# Patient Record
Sex: Female | Born: 1970 | Race: White | Hispanic: No | Marital: Single | Smoking: Never smoker
Health system: Southern US, Community
[De-identification: ages and names within clinical notes are randomized; demographics above are authoritative.]

## PROBLEM LIST (undated history)

## (undated) DIAGNOSIS — E669 Obesity, unspecified: Secondary | ICD-10-CM

## (undated) DIAGNOSIS — E559 Vitamin D deficiency, unspecified: Secondary | ICD-10-CM

## (undated) DIAGNOSIS — M1990 Unspecified osteoarthritis, unspecified site: Secondary | ICD-10-CM

## (undated) DIAGNOSIS — E079 Disorder of thyroid, unspecified: Secondary | ICD-10-CM

## (undated) DIAGNOSIS — F419 Anxiety disorder, unspecified: Secondary | ICD-10-CM

## (undated) DIAGNOSIS — E039 Hypothyroidism, unspecified: Secondary | ICD-10-CM

## (undated) DIAGNOSIS — F32A Depression, unspecified: Secondary | ICD-10-CM

## (undated) DIAGNOSIS — E785 Hyperlipidemia, unspecified: Secondary | ICD-10-CM

## (undated) DIAGNOSIS — F329 Major depressive disorder, single episode, unspecified: Secondary | ICD-10-CM

## (undated) HISTORY — PX: NASAL SINUS SURGERY: SHX719

## (undated) HISTORY — DX: Anxiety disorder, unspecified: F41.9

## (undated) HISTORY — PX: TUBAL LIGATION: SHX77

## (undated) HISTORY — DX: Unspecified osteoarthritis, unspecified site: M19.90

## (undated) HISTORY — PX: WISDOM TOOTH EXTRACTION: SHX21

## (undated) HISTORY — DX: Obesity, unspecified: E66.9

## (undated) HISTORY — DX: Disorder of thyroid, unspecified: E07.9

## (undated) HISTORY — DX: Major depressive disorder, single episode, unspecified: F32.9

## (undated) HISTORY — DX: Vitamin D deficiency, unspecified: E55.9

## (undated) HISTORY — DX: Depression, unspecified: F32.A

## (undated) HISTORY — DX: Hyperlipidemia, unspecified: E78.5

## (undated) HISTORY — DX: Hypothyroidism, unspecified: E03.9

---

## 1998-09-03 ENCOUNTER — Other Ambulatory Visit: Admission: RE | Admit: 1998-09-03 | Discharge: 1998-09-03 | Payer: Self-pay | Admitting: Obstetrics and Gynecology

## 1999-06-07 ENCOUNTER — Other Ambulatory Visit: Admission: RE | Admit: 1999-06-07 | Discharge: 1999-06-07 | Payer: Self-pay | Admitting: Obstetrics and Gynecology

## 2000-01-12 ENCOUNTER — Encounter (INDEPENDENT_AMBULATORY_CARE_PROVIDER_SITE_OTHER): Payer: Self-pay

## 2000-01-12 ENCOUNTER — Inpatient Hospital Stay (HOSPITAL_COMMUNITY): Admission: AD | Admit: 2000-01-12 | Discharge: 2000-01-16 | Payer: Self-pay | Admitting: Obstetrics and Gynecology

## 2000-01-17 ENCOUNTER — Encounter: Admission: RE | Admit: 2000-01-17 | Discharge: 2000-03-01 | Payer: Self-pay | Admitting: Obstetrics and Gynecology

## 2000-01-17 ENCOUNTER — Inpatient Hospital Stay (HOSPITAL_COMMUNITY): Admission: AD | Admit: 2000-01-17 | Discharge: 2000-01-17 | Payer: Self-pay | Admitting: *Deleted

## 2000-08-22 ENCOUNTER — Other Ambulatory Visit: Admission: RE | Admit: 2000-08-22 | Discharge: 2000-08-22 | Payer: Self-pay | Admitting: Obstetrics and Gynecology

## 2001-11-18 ENCOUNTER — Other Ambulatory Visit: Admission: RE | Admit: 2001-11-18 | Discharge: 2001-11-18 | Payer: Self-pay | Admitting: Obstetrics and Gynecology

## 2002-12-24 ENCOUNTER — Other Ambulatory Visit: Admission: RE | Admit: 2002-12-24 | Discharge: 2002-12-24 | Payer: Self-pay | Admitting: Obstetrics and Gynecology

## 2003-08-14 ENCOUNTER — Ambulatory Visit (HOSPITAL_COMMUNITY): Admission: RE | Admit: 2003-08-14 | Discharge: 2003-08-14 | Payer: Self-pay | Admitting: Obstetrics and Gynecology

## 2003-12-28 ENCOUNTER — Encounter (INDEPENDENT_AMBULATORY_CARE_PROVIDER_SITE_OTHER): Payer: Self-pay | Admitting: *Deleted

## 2003-12-28 ENCOUNTER — Inpatient Hospital Stay (HOSPITAL_COMMUNITY): Admission: RE | Admit: 2003-12-28 | Discharge: 2003-12-31 | Payer: Self-pay | Admitting: Obstetrics and Gynecology

## 2004-02-08 ENCOUNTER — Other Ambulatory Visit: Admission: RE | Admit: 2004-02-08 | Discharge: 2004-02-08 | Payer: Self-pay | Admitting: Obstetrics and Gynecology

## 2004-11-07 ENCOUNTER — Emergency Department (HOSPITAL_COMMUNITY): Admission: EM | Admit: 2004-11-07 | Discharge: 2004-11-08 | Payer: Self-pay | Admitting: Emergency Medicine

## 2005-05-02 ENCOUNTER — Other Ambulatory Visit: Admission: RE | Admit: 2005-05-02 | Discharge: 2005-05-02 | Payer: Self-pay | Admitting: Obstetrics and Gynecology

## 2006-01-04 ENCOUNTER — Ambulatory Visit (HOSPITAL_COMMUNITY): Admission: RE | Admit: 2006-01-04 | Discharge: 2006-01-04 | Payer: Self-pay | Admitting: Internal Medicine

## 2008-05-01 ENCOUNTER — Ambulatory Visit: Payer: Self-pay | Admitting: Internal Medicine

## 2008-06-26 ENCOUNTER — Ambulatory Visit: Payer: Self-pay | Admitting: Internal Medicine

## 2009-01-22 ENCOUNTER — Ambulatory Visit: Payer: Self-pay | Admitting: Internal Medicine

## 2009-08-17 ENCOUNTER — Other Ambulatory Visit: Admission: RE | Admit: 2009-08-17 | Discharge: 2009-08-17 | Payer: Self-pay | Admitting: Internal Medicine

## 2009-08-17 ENCOUNTER — Ambulatory Visit: Payer: Self-pay | Admitting: Internal Medicine

## 2010-02-17 ENCOUNTER — Ambulatory Visit: Payer: Self-pay | Admitting: Internal Medicine

## 2010-03-09 ENCOUNTER — Ambulatory Visit (HOSPITAL_COMMUNITY)
Admission: RE | Admit: 2010-03-09 | Discharge: 2010-03-09 | Payer: Self-pay | Source: Home / Self Care | Attending: Internal Medicine | Admitting: Internal Medicine

## 2010-08-19 NOTE — Discharge Summary (Signed)
Brandy Price, Brandy Price         ACCOUNT NO.:  [id number]   MEDICAL RECORD NO.:  [id number]          PATIENT TYPE:  INP   LOCATION:  9138                          FACILITY:  [facility]   PHYSICIAN:  Janine Limbo, M.D.DATE OF BIRTH:  January 11, 1971   DATE OF ADMISSION:  12/28/2003  DATE OF DISCHARGE:  12/31/2003                                 DISCHARGE SUMMARY   ADMISSION DIAGNOSES:  1.  Term pregnancy.  2.  Prior cesarean section.  3.  Desires elective sterilization.   DISCHARGE DIAGNOSES:  1.  Term pregnancy.  2.  Prior cesarean section.  3.  Desires elective sterilization.  4.  Status post repeat low transverse cesarean section of a viable female      infant named Jonnie Finner weighing 8 pounds 11 ounces, Apgars 8 and      9.  5.  Status post bilateral tubal ligation.  6.  Difficult spinal anesthesia with wet tap.   HOSPITAL PROCEDURES:  1.  Spinal anesthesia.  2.  Repeat low transverse cesarean section.  3.  Elective bilateral tubal ligation.   HOSPITAL COURSE:  The patient was admitted for an elective repeat low  transverse cesarean section with bilateral tubal ligation.  Multiple  attempts at spinal anesthesia were required to achieve spinal anesthesia.  The surgery progressed without incident for an 8-pound 11-ounce female  infant, Apgars 8 and 9, with EBL of 700 mL.  The patient was taken to the  mother-baby unit after recovery where she did well.  Vital signs were  stable.  Hemoglobin was 11.3.  She was breast feeding and continued her care  on postoperative day #2 and #3.  On December 31, 2003 she was ready to go  home.  She did have some soreness at her spinal site.  Vital signs are  stable.  There was a small abrasion noted at her spinal site with a small  amount of erythema but no induration.  Abdomen was soft and appropriately  tender.  Incision was clean, dry, and intact.  The lochia was small,  extremities within normal limits.  She was seen by anesthesia  for the  abrasion and was discharged home after she was deemed stable.   DISCHARGE MEDICATIONS:  1.  Motrin 600 mg p.o. q.6h. p.r.n.  2.  Tylox one to two p.o. q.4h. p.r.n.   DISCHARGE LABORATORY DATA:  White blood cell count 11.2, hemoglobin 11.3,  platelets 249.   DISCHARGE INSTRUCTIONS:  Per [facility] handout.   DISCHARGE FOLLOW-UP:  In 6 weeks or p.r.n.      MLW/MEDQ  D:  12/31/2003  T:  12/31/2003  Job:  [id number]

## 2010-08-19 NOTE — Op Note (Signed)
[facility]  Patient:    BANEZA, BARTOSZEK                  MRN: [id number] Proc. Date: 01/12/00 Adm. Date:  [id number] Attending:  Cleatrice Burke                           Operative Report  PREOPERATIVE DIAGNOSES:       1. Term pregnancy.                               2. Meconium-stained fluid.                               3. Nonreassuring fetal heart rate tracing.                               4. Maternal fever.  POSTOPERATIVE DIAGNOSES:      1. Term pregnancy.                               2. Meconium-stained fluid.                               3. Nonreassuring fetal heart rate tracing.                               4. Maternal fever.                               5. Occipitoposterior.                               6. Cord entanglement.  OPERATION PERFORMED:          Low cervical transverse cesarean section.  SURGEON:                      Georgina Peer, M.D.  ASSISTANT:                    Miguel Dibble, C.N.M.  ANESTHESIA:                   Epidural by Gretta Cool., M.D.  ESTIMATED BLOOD LOSS:         400 cc.  COMPLICATIONS:                None.  FINDINGS:                     A viable female infant delivered at 2147 hours, occipitoposterior with cord around the shoulder and leg.  Thin meconium-stained fluid.  Apgars 8 and 9.  INDICATIONS:                  This is a 40 year old female who is a gravida 1, para 0, with an EDC of January 07, 2000, at 40-5/7 weeks.  She presented with ruptured membranes and meconium-stained fluid in the early morning of January 12, 2000.  She presented in labor.  She  had episodes of variable decelerations lasting anywhere from 2-12 minutes.  Amnioinfusion to thin the meconium-stained fluid and to try an resolve the variables was attempted.  The patient developed a low-grade fever and was started on Unasyn.  She had been group B streptococcus and had initially been on penicillin.  The  patient was given Tylenol for fever and the fever defervesced, but the fetus remained tachycardic with heart rates between 160-180, which did not respond to repositioning, oxygen, and hydration.  The cervix became completely dilated. However, it was the opinion of the surgeon that the fetus would not tolerate labor with this persistent tachycardia, the meconium-stained fluid, and the fever.  Therefore, cesarean section was planned and consented.  DESCRIPTION OF PROCEDURE:     The patient was taken to the operating room.  An epidural had been placed during labor.  A Foley catheter had been placed during labor.  She was prepped and draped in a normal sterile fashion after adequate anesthesia was established.  A transverse skin incision was made and taken into the peritoneal cavity in the normal Pfannenstiel manner.  A transverse bladder flap was created along with a transverse uterine incision. The amnioinfusion had thinned the meconium significantly.  The infant was in the occipitoposterior presentation, rotated to anterior, and delivered with the aid of fundal pressure.  The mouth and nose were suctioned with DeLee bulb.  The infant had its cord doubly clamped and cut and was handed to the pediatric team in attendance.  Cord blood and cord pH were obtained from the artery.  Apgars were assigned at 8 and 9.  The placenta was removed and sent to pathology.  The incision was without extension and closed in one layer with a running locked Vicryl with interrupted sutures of Vicryl to control any bleeding.  The tubes and ovaries were inspected and found to be normal.  The bladder flap, muscle, fascia, and peritoneum were hemostatic.  The fascia was closed with Vicryl suture.  The subcutaneous tissue was cauterized for any bleeding.  The skin was closed skin staples.  The sponge, needle, and instrument counts were correct.  The patient tolerated the procedure well and was sent to the recovery  area in stable condition. DD:  01/12/00 TD:  01/14/00 Job: [id number]

## 2010-08-19 NOTE — Op Note (Signed)
NAMECHANTILLE, Brandy Price         ACCOUNT NO.:  [id number]   MEDICAL RECORD NO.:  [id number]          PATIENT TYPE:  INP   LOCATION:  9138                          FACILITY:  [facility]   PHYSICIAN:  Janine Limbo, M.D.DATE OF BIRTH:  09-02-1970   DATE OF PROCEDURE:  12/28/2003  DATE OF DISCHARGE:                                 OPERATIVE REPORT   PREOPERATIVE DIAGNOSES:  1.  Term intrauterine pregnancy.  2.  Prior cesarean section.  3.  Desires repeat cesarean section.  4.  Desires sterilization.   POSTOPERATIVE DIAGNOSES:  1.  Term intrauterine pregnancy.  2.  Prior cesarean section.  3.  Desires repeat cesarean section.  4.  Desires sterilization.   PROCEDURE:  Repeat low transverse cesarean section and bilateral tubal  ligation.   SURGEON:  Janine Limbo, M.D.   FIRST ASSISTANT:  Marie L. Williams, C.N.M.   ANESTHESIA:  Spinal.   DISPOSITION:  Ms. Sculley is a 40 year old female, gravida 2, para 1-0-0-  1, who presents at term for repeat cesarean section and tubal ligation. She  understands the indications for her procedure and she accepts the associated  risks.   FINDINGS:  An 8 pound 11 ounce female infant Brandy Price) was delivered from a  cephalic presentation. The Apgar's were 8 at 1 minute and 9 at 5 minutes.  The uterus, fallopian tubes and the ovaries were normal for the gravid  state.   DESCRIPTION OF PROCEDURE:  The patient was taken to the operating room where  a spinal anesthetic was given.  The anesthesia department had a great deal  of difficulty putting the spinal in. The patient's abdomen, perineum and  outer vagina were prepped with multiple layers of Betadine. A Foley catheter  was placed in the bladder. The patient was then sterilely draped. The lower  abdomen was injected with 10 mL of 0.5% Marcaine with epinephrine.  A low  transverse incision was made and carried sharply through the subcutaneous  tissue, the fascia and the anterior  peritoneum.  The bladder flap was  developed. An incision was made in the lower uterine segment and extended  transversely. The fetal head was delivered. The mouth and nose were  suctioned. The remainder of the infant was delivered. A Nuchal cord was  present. The cord was clamped and cut and the infant was handed to the  awaiting pediatric team. Routine cord blood studies were obtained. The  placenta was removed. The uterine cavity was cleaned of amniotic fluid,  clotted blood and membranes. The uterine incision was closed using a running  locking suture of 2-0 Vicryl.  Figure-of-eight sutures of 2-0 Vicryl were  used for hemostasis.  The pelvis was vigorously irrigated. The left  fallopian tube was identified and followed to its fimbriated end. A knuckle  of tube was made on the left using a pre-tie and then a ligature of #0 plain  catgut. The knuckle of tube _________ was excised.  Hemostasis was adequate.  An identical procedure was carried out on the opposite side. Again  hemostasis was adequate. The anterior peritoneum and the abdominal  musculature were reapproximated in the  midline. The fascia was irrigated.  The fascia was closed using a running suture of #0 Vicryl followed by three  interrupted sutures of #0 Vicryl. The subcutaneous tissue was irrigated. The  subcutaneous layer was closed using a running suture of #0 Vicryl. The skin  was reapproximated using a subcuticular suture of 4-0 Vicryl.  Sponge,  needle, and instrument counts were correct on two occasions. The estimated  blood loss was 700 mL.  The patient tolerated the procedure well.  The  patient was taken to the recovery room in stable condition. The infant was  taken to the fullterm nursery in stable condition.      AVS/MEDQ  D:  12/28/2003  T:  12/29/2003  Job:  [id number]

## 2010-08-19 NOTE — H&P (Signed)
Brandy Price, Brandy Price         ACCOUNT NO.:  [id number]   MEDICAL RECORD NO.:  [id number]          PATIENT TYPE:  INP   LOCATION:  NA                            [facility]  [facility]   PHYSICIAN:  Janine Limbo, M.D.DATE OF BIRTH:  1970-12-10   DATE OF ADMISSION:  12/28/2003  DATE OF DISCHARGE:                                HISTORY & PHYSICAL   HISTORY OF PRESENT ILLNESS:  Ms. Brandy Price is a 40 year old female, gravida  2 para 1-0-0-1, who presents at [redacted] weeks gestation ([facility] January 09, 2004).  She has been followed at the [facility].  This pregnancy has been  complicated by the fact that her age is greater than 35.  She declined  amniocentesis.  The patient has had a prior cesarean section and she plans a  repeat cesarean section.  The patient had a positive beta strep culture with  her prior pregnancy but a negative beta strep culture in the third trimester  during this pregnancy.   OBSTETRICAL HISTORY:  In 2001 the patient had a 9-pound female infant at [redacted]  weeks gestation.   DRUG ALLERGIES:  No known drug allergies.   PAST MEDICAL HISTORY:  The patient denies hypertension and diabetes.  She  had her wisdom teeth removed at age 23.  She had a cesarean section in 2001.   REVIEW OF SYSTEMS:  Normal pregnancy complaints.   SOCIAL HISTORY:  The patient is a married homemaker.  She denies cigarette  use, alcohol use, and recreational drug use.   FAMILY HISTORY:  Noncontributory.   PHYSICAL EXAMINATION:  VITALS:  Weight is 219 pounds.  HEENT:  Within normal limits.  CHEST:  Clear.  HEART:  Regular rate and rhythm.  BREASTS:  Without masses.  ABDOMEN:  Gravid with a fundal height of 37 cm.  EXTREMITIES:  Grossly normal.  NEUROLOGIC:  Grossly normal.  PELVIC:  Cervix was closed and long when last checked.   LABORATORY VALUES:  Blood type is AB positive, antibody screen negative,  VDRL nonreactive,  rubella positive, HBsAg negative, GC negative, Chlamydia  negative, Pap smear within normal limits, third trimester beta strep is  negative, glucola screen was 68.   ASSESSMENT:  1.  Thirty weight weeks gestation.  2.  Prior cesarean section.  3.  Desires sterilization.   PLAN:  The patient will undergo a repeat low transverse cesarean section and  bilateral tubal ligation.  She understands the indications for her procedure  and she accepts the risk of, but not limited to, anesthetic complications,  bleeding, infections, and possible damage to the surrounding organs.  She  understands that there is a small but real failure rate associated with  tubal ligation (17 per 1000).      AVS/MEDQ  D:  12/27/2003  T:  12/27/2003  Job:  [id number]

## 2010-08-19 NOTE — H&P (Signed)
[facility]  Patient:    Brandy Price, Brandy Price                  MRN: [id number] Adm. Date:  [id number] Attending:  Cleatrice Burke Dictator:   Miguel Dibble, C.N.M.                         History and Physical  DATE OF BIRTH:                1970-09-03.  HISTORY OF PRESENT ILLNESS:   This is a 40 year old gravida 1, para 0 at 40-6/[redacted] weeks pregnant, who reported spontaneous rupture of membranes, initially clear fluid at 4:15 a.m..  She presented to Maternity Admissions Unit two hours later with gross rupture of membranes, meconium-stained fluid. Cervix was 3 cm, 85% effaced, vertex at -1, by R.N. exam.  She was contracting at every three and a half to four and a half minutes, with a negative CST. She was admitted for labor management by the physician service.  PRENATAL LABORATORY DATA:     At entry into the practice:  Hemoglobin 12.1, hematocrit 37.1, platelets 296,000.  Blood type and Rh:  AB-positive, Rh-antibodies negative.  Toxoplasmosis negative.  VDRL nonreactive.  Rubella titer immune.  CMV titers negative.  Hepatitis B surface antigen negative. HIV nonreactive.  Pap smear within normal limits.  Gonorrhea and Chlamydia culture negative.  PPD test in December of 2000 negative.  Glucola challenge test at 28 weeks is 82.  Patient transferred from care from another practice with Dr. Lafonda Mosses B. Collins at approximately 26-2/7 weeks with records.  She had had no problems at that point during her pregnancy.  At 36 weeks, group beta strep positive.  She had had an ultrasound that confirmed EDC of October 6th.  ALLERGIES:                    No known drug allergies.  MEDICAL HISTORY:              UTIs in the past.  Fractured arm at age 45. Wisdom tooth extraction in 1990.  Cyst removed from her nose.  FAMILY HISTORY:               Family history is significant for maternal grandmother with Alzheimers disease; otherwise, negative.  GENETIC  HISTORY:              Genetic history is significant for paternal first cousin with heart defect and died at age 38 and paternal first cousin with mental retardation.  SOCIAL HISTORY:               Caucasian.  Christian religion.  Married to [employer].  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  Father of the baby is a Engineer, maintenance (IT) and works for [employer].  Stable monogamous relationship.  Denies smoking, alcohol or drug abuse.  PHYSICAL EXAMINATION  HEENT:                        Within normal limits.  LUNGS:                        Bilaterally clear.  HEART:                        Regular rate and rhythm.  ABDOMEN:  Soft, nontender.  Contractions every three and a half to four and a half minutes.  Fetal heart rate:  Mild variable deceleration during vaginal exam; otherwise, reassuring with negative CST.  EXTREMITIES:                  Trace edema.  DTRs +1.  PELVIC:                       Cervix 3 cm, 85% effaced, vertex -1.  ASSESSMENT:                   Primigravida at term, positive group beta streptococcus, ruptured membranes with meconium-stained fluid, in early labor.  PLAN:                         Admit to labor and delivery.  Plan for M.D. management per patient request.  Notify _____________ of admission and status.  Anticipate routine [facility] orders including group beta strep prophylaxis with penicillin.  Anticipate normal spontaneous vaginal delivery. DD:  01/12/00 TD:  01/12/00 Job: [id number]

## 2010-08-19 NOTE — Discharge Summary (Signed)
[facility]  Patient:    Brandy Price, Brandy Price                  MRN: [id number] Adm. Date:  [id number] Disc. Date: 01/16/00 Attending:  Cleatrice Burke Dictator:   Miguel Dibble, C.N.M.                           Discharge Summary  DATE OF BIRTH:                03/20/1971  ADMITTING DIAGNOSES:          1. Intrauterine pregnancy at term with                                  spontaneous rupture of membranes five hours                                  prior to admission with clear fluid.                               2. Positive group B strep.                               3. Early labor.                               4. Primigravida.  DISCHARGE DIAGNOSES:          1. Intrauterine pregnancy at term with                                  spontaneous rupture of membranes five hours                                  prior to admission with clear fluid.                               2. Positive group B strep.                               3. Early labor.                               4. Primigravida.                               5. Nonreassuring fetal heart tones.                               6. Maternal fever.                               7. Meconium-stained fluid.  8. Delivered by primary lower segment transverse                                  cesarean section, viable baby boy, Apgars 8                                  and 9, weight 8 pounds 15.9 ounces, cord                                  around the shoulder and leg.  PROCEDURES:                   1. Primary lower segment transverse cesarean                                  section.                               2. Epidural anesthesia.                               3. Internal fetal monitoring.                               4. Amnioinfusion.  HOSPITAL COURSE:              On January 12, 2000, at approximately 10 a.m., the patient, Brandy Price, was  admitted with a history of ruptured membranes approximately 5-1/2 hours earlier with clear fluid and mild contractions.  Her cervix was 3, 85%, vertex, -1, with gross rupture of membranes, clear fluid at the time.  By 9:40 a.m., she was having contractions every 3-5 minutes with accelerations, occasional variable decelerations.  By 11 a.m., the patient was experiencing a deceleration to the 80s with very slow recovery after 8 minutes with position changes and O2.  Recovery to 110-120s. Amnioinfusion was resumed.  Pitocin had been discontinued at the time of the decelerations.  By 1430, baseline heart rate was 150-160s, cervix was 5 cm completely effaced, vertex at 0 station.  An epidural was given.  By 2000 hours, heart rate was 180s-190s, temperature 100.9, short-term variability was satisfactory.  Contractions had spread to every 5-7 minutes.  There was no improvement with amnioinfusion, and it was discontinued.  The cervix was an interior lip vertex at -1 with molding.  Prominent ischial spines were present.  There was a prolonged variable deceleration with slow recovery.  By 2115, heart rate persisted in the 165-180s.  Temperature was within normal limits.  Cervix was complete vertex at -120; however, there was decreased short-term variability.  After discussions with the patient and the family, the decision was made to proceed with primary lower segment transverse cesarean section.  She delivered a viable baby boy with the cord wrapped around the body, shoulder, and legs.  Thin, meconium-stained fluid, Apgars 8 and 9.  She recovered satisfactorily from her cesarean section.  On postoperative day one on January 13, 2000, she was afebrile.  Hemoglobin had dropped from 11.3 to 10.9; lochia was small, incision  clean, dry and intact. By postoperative day two, she continued to be afebrile.  Her baby was in the NICU with possible meconium aspiration versus pneumonia versus tachypnea, and she  was spending a considerable amount of time with the baby in the nursery. By postoperative day three, she continued to recover satisfactorily with pumping her breasts for eventual breast feeding.  Incision was clean, dry, and intact.  Vital signs stable.  She decided to stay an extra day which was covered by her insurance.  By postoperative day four, on January 16, 2000, vital signs were stable, fundus firm one below the umbilicus, lochia was scant, abdomen soft and nontender.  She was passing flatus, had good bowel sounds.  Incision was clean, dry, and intact.  She was discharged home after having staples removed and Steri-Strips applied and deeming to have received the full benefit of her hospitalization.  She would come frequently to visit the baby after discharge.  DISCHARGE INSTRUCTIONS:       Per [facility] booklet.  DISCHARGE MEDICATIONS:        Motrin, Tylox, prenatal vitamins, over-the-counter iron.  She would decide on contraception at her six week visit.  DISCHARGE FOLLOW-UP:          In six weeks at [facility]. DD:  01/16/00 TD:  01/15/00 Job: [id number]

## 2010-09-16 ENCOUNTER — Other Ambulatory Visit: Payer: 59 | Admitting: Internal Medicine

## 2010-09-16 DIAGNOSIS — E039 Hypothyroidism, unspecified: Secondary | ICD-10-CM

## 2010-09-16 LAB — TSH: TSH: 2.156 u[IU]/mL (ref 0.350–4.500)

## 2010-09-19 ENCOUNTER — Encounter: Payer: Self-pay | Admitting: Internal Medicine

## 2010-10-10 ENCOUNTER — Other Ambulatory Visit: Payer: Self-pay | Admitting: *Deleted

## 2010-10-10 MED ORDER — LEVOTHYROXINE SODIUM 100 MCG PO TABS: 100.0000 ug | ORAL_TABLET | Freq: Every day | ORAL | Status: DC

## 2010-10-31 ENCOUNTER — Other Ambulatory Visit: Payer: 59 | Admitting: Internal Medicine

## 2010-10-31 DIAGNOSIS — Z0000 Encounter for general adult medical examination without abnormal findings: Secondary | ICD-10-CM

## 2010-10-31 LAB — CBC WITH DIFFERENTIAL/PLATELET
Basophils Absolute: 0.1 10*3/uL (ref 0.0–0.1)
Lymphocytes Relative: 30 % (ref 12–46)
Neutro Abs: 3.8 10*3/uL (ref 1.7–7.7)
Neutrophils Relative %: 58 % (ref 43–77)
Platelets: 373 10*3/uL (ref 150–400)
RDW: 12.7 % (ref 11.5–15.5)
WBC: 6.5 10*3/uL (ref 4.0–10.5)

## 2010-10-31 LAB — COMPREHENSIVE METABOLIC PANEL
ALT: 44 U/L — ABNORMAL HIGH (ref 0–35)
AST: 30 U/L (ref 0–37)
Albumin: 4.1 g/dL (ref 3.5–5.2)
Calcium: 9.8 mg/dL (ref 8.4–10.5)
Chloride: 103 mEq/L (ref 96–112)
Creat: 0.66 mg/dL (ref 0.50–1.10)
Potassium: 4.7 mEq/L (ref 3.5–5.3)

## 2010-10-31 LAB — LIPID PANEL
LDL Cholesterol: 162 mg/dL — ABNORMAL HIGH (ref 0–99)
Triglycerides: 115 mg/dL (ref <150)
VLDL: 23 mg/dL (ref 0–40)

## 2010-11-01 ENCOUNTER — Encounter: Payer: Self-pay | Admitting: Internal Medicine

## 2010-11-01 ENCOUNTER — Ambulatory Visit (INDEPENDENT_AMBULATORY_CARE_PROVIDER_SITE_OTHER): Payer: 59 | Admitting: Internal Medicine

## 2010-11-01 ENCOUNTER — Other Ambulatory Visit (HOSPITAL_COMMUNITY)
Admission: RE | Admit: 2010-11-01 | Discharge: 2010-11-01 | Disposition: A | Discharge status: Home / Self Care | Payer: 59 | Source: Ambulatory Visit | Attending: Internal Medicine | Admitting: Internal Medicine

## 2010-11-01 VITALS — BP 112/66 | HR 78 | Temp 99.0°F | Ht 68.0 in | Wt 230.0 lb

## 2010-11-01 DIAGNOSIS — E785 Hyperlipidemia, unspecified: Secondary | ICD-10-CM

## 2010-11-01 DIAGNOSIS — F419 Anxiety disorder, unspecified: Secondary | ICD-10-CM

## 2010-11-01 DIAGNOSIS — E039 Hypothyroidism, unspecified: Secondary | ICD-10-CM

## 2010-11-01 DIAGNOSIS — Z0000 Encounter for general adult medical examination without abnormal findings: Secondary | ICD-10-CM

## 2010-11-01 DIAGNOSIS — F341 Dysthymic disorder: Secondary | ICD-10-CM

## 2010-11-01 DIAGNOSIS — E669 Obesity, unspecified: Secondary | ICD-10-CM

## 2010-11-01 DIAGNOSIS — Z01419 Encounter for gynecological examination (general) (routine) without abnormal findings: Secondary | ICD-10-CM | POA: Insufficient documentation

## 2010-11-01 DIAGNOSIS — F32A Depression, unspecified: Secondary | ICD-10-CM

## 2010-11-01 DIAGNOSIS — Z124 Encounter for screening for malignant neoplasm of cervix: Secondary | ICD-10-CM

## 2010-11-01 LAB — POCT URINALYSIS DIPSTICK
Blood, UA: NEGATIVE
Glucose, UA: NEGATIVE
Nitrite, UA: NEGATIVE
Protein, UA: NEGATIVE
Urobilinogen, UA: NEGATIVE
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
## 2011-02-15 ENCOUNTER — Other Ambulatory Visit: Payer: Self-pay | Admitting: Internal Medicine

## 2011-02-15 DIAGNOSIS — Z1231 Encounter for screening mammogram for malignant neoplasm of breast: Secondary | ICD-10-CM

## 2011-03-30 ENCOUNTER — Ambulatory Visit (HOSPITAL_COMMUNITY)
Admission: RE | Admit: 2011-03-30 | Discharge: 2011-03-30 | Disposition: A | Discharge status: Home / Self Care | Payer: 59 | Source: Ambulatory Visit | Attending: Internal Medicine | Admitting: Internal Medicine

## 2011-03-30 DIAGNOSIS — Z1231 Encounter for screening mammogram for malignant neoplasm of breast: Secondary | ICD-10-CM | POA: Insufficient documentation

## 2011-03-31 NOTE — Progress Notes (Signed)
Patient has appt 04/20/11 to see Dr. Lenord Fellers.  Dr Lenord Fellers is aware.  LMS

## 2011-04-18 ENCOUNTER — Other Ambulatory Visit: Payer: 59 | Admitting: Internal Medicine

## 2011-04-20 ENCOUNTER — Ambulatory Visit: Payer: 59 | Admitting: Internal Medicine

## 2011-05-19 ENCOUNTER — Other Ambulatory Visit: Payer: Self-pay

## 2011-06-19 ENCOUNTER — Other Ambulatory Visit: Payer: Self-pay

## 2011-06-19 MED ORDER — LEVOTHYROXINE SODIUM 100 MCG PO TABS: 100.0000 ug | ORAL_TABLET | Freq: Every day | ORAL | Status: DC

## 2011-06-26 ENCOUNTER — Other Ambulatory Visit: Payer: Self-pay

## 2011-06-26 MED ORDER — SERTRALINE HCL 100 MG PO TABS: 100.0000 mg | ORAL_TABLET | Freq: Every day | ORAL | Status: DC

## 2011-07-14 ENCOUNTER — Other Ambulatory Visit: Payer: 59 | Admitting: Internal Medicine

## 2011-07-14 ENCOUNTER — Other Ambulatory Visit: Payer: Self-pay | Admitting: Internal Medicine

## 2011-07-14 DIAGNOSIS — E785 Hyperlipidemia, unspecified: Secondary | ICD-10-CM

## 2011-07-15 LAB — LIPID PANEL
LDL Cholesterol: 148 mg/dL — ABNORMAL HIGH (ref 0–99)
Triglycerides: 74 mg/dL (ref <150)

## 2011-07-18 ENCOUNTER — Ambulatory Visit (INDEPENDENT_AMBULATORY_CARE_PROVIDER_SITE_OTHER): Payer: 59 | Admitting: Internal Medicine

## 2011-07-18 ENCOUNTER — Encounter: Payer: Self-pay | Admitting: Internal Medicine

## 2011-07-18 VITALS — BP 120/80 | HR 80 | Resp 12 | Wt 235.0 lb

## 2011-07-18 DIAGNOSIS — F329 Major depressive disorder, single episode, unspecified: Secondary | ICD-10-CM

## 2011-07-18 DIAGNOSIS — F32A Depression, unspecified: Secondary | ICD-10-CM

## 2011-07-18 DIAGNOSIS — E785 Hyperlipidemia, unspecified: Secondary | ICD-10-CM

## 2011-07-18 DIAGNOSIS — E039 Hypothyroidism, unspecified: Secondary | ICD-10-CM

## 2011-07-18 LAB — HEPATIC FUNCTION PANEL
Alkaline Phosphatase: 69 U/L (ref 39–117)
Bilirubin, Direct: 0.1 mg/dL (ref 0.0–0.3)
Indirect Bilirubin: 0.6 mg/dL (ref 0.0–0.9)
Total Bilirubin: 0.7 mg/dL (ref 0.3–1.2)
Total Protein: 6.7 g/dL (ref 6.0–8.3)

## 2011-07-18 NOTE — Patient Instructions (Addendum)
Increase Zocor to 20 mg daily. Return in 6 months for fasting lab work and physical examination. Continue same dose of Synthroid.

## 2011-07-23 NOTE — Progress Notes (Signed)
  Subjective:    Patient ID: Brandy Price, female    DOB: Aug 27, 1970, 41 y.o.   MRN: [id number]  HPI  41 year old white female with history of hypothyroidism, hyperlipidemia, and depression in today for six-month followup. No complaints or problems. Also has history of hyperlipidemia. She is overweight. Needs to diet and exercise. She is a single parent, divorced, raising 2 small children. They're in elementary school. Is on Zoloft for depression. Patient is on Zocor 10 mg daily for hyperlipidemia. Recent lipid panel shows total cholesterol and LDL still not to be at goal.    Review of Systems     Objective:   Physical Exam neck no thyromegaly or adenopathy; chest clear to auscultation; cardiac exam regular rate and rhythm, extremities without edema        Assessment & Plan:  Hypothyroidism  Depression  Hyperlipidemia  Plan: TSH reviewed. Continue same dose of Synthroid to return in 6 months for physical examination. Increase Zocor generic to 20 mg daily. Will obtain fasting lab work in 6 months at time of physical exam. Continue Zoloft for depression.

## 2011-07-26 ENCOUNTER — Other Ambulatory Visit: Payer: Self-pay

## 2011-07-26 MED ORDER — LEVOTHYROXINE SODIUM 100 MCG PO TABS: 100.0000 ug | ORAL_TABLET | Freq: Every day | ORAL | Status: DC

## 2011-07-31 ENCOUNTER — Encounter: Payer: Self-pay | Admitting: Internal Medicine

## 2011-12-12 DIAGNOSIS — Z0000 Encounter for general adult medical examination without abnormal findings: Secondary | ICD-10-CM

## 2012-01-09 ENCOUNTER — Other Ambulatory Visit: Payer: Self-pay | Admitting: Internal Medicine

## 2012-01-18 ENCOUNTER — Other Ambulatory Visit: Payer: 59 | Admitting: Internal Medicine

## 2012-01-19 ENCOUNTER — Encounter: Payer: 59 | Admitting: Internal Medicine

## 2012-02-07 ENCOUNTER — Other Ambulatory Visit: Payer: Self-pay | Admitting: Internal Medicine

## 2012-02-08 ENCOUNTER — Other Ambulatory Visit: Payer: Self-pay

## 2012-02-08 MED ORDER — LEVOTHYROXINE SODIUM 100 MCG PO TABS: 100.0000 ug | ORAL_TABLET | Freq: Every day | ORAL | Status: DC

## 2012-02-08 NOTE — Telephone Encounter (Signed)
Patient has CPE scheduled for January 7,2014

## 2012-02-15 ENCOUNTER — Other Ambulatory Visit: Payer: Self-pay | Admitting: Internal Medicine

## 2012-04-19 ENCOUNTER — Other Ambulatory Visit: Payer: Self-pay | Admitting: Internal Medicine

## 2012-04-19 ENCOUNTER — Other Ambulatory Visit: Payer: 59 | Admitting: Internal Medicine

## 2012-04-19 DIAGNOSIS — Z0000 Encounter for general adult medical examination without abnormal findings: Secondary | ICD-10-CM

## 2012-04-19 DIAGNOSIS — E785 Hyperlipidemia, unspecified: Secondary | ICD-10-CM

## 2012-04-19 DIAGNOSIS — E039 Hypothyroidism, unspecified: Secondary | ICD-10-CM

## 2012-04-19 LAB — CBC WITH DIFFERENTIAL/PLATELET
Eosinophils Absolute: 0.1 10*3/uL (ref 0.0–0.7)
Eosinophils Relative: 2 % (ref 0–5)
HCT: 39.2 % (ref 36.0–46.0)
Hemoglobin: 13.3 g/dL (ref 12.0–15.0)
Lymphocytes Relative: 24 % (ref 12–46)
Lymphs Abs: 2 10*3/uL (ref 0.7–4.0)
MCH: 30.3 pg (ref 26.0–34.0)
MCV: 89.3 fL (ref 78.0–100.0)
Monocytes Relative: 8 % (ref 3–12)
RBC: 4.39 MIL/uL (ref 3.87–5.11)

## 2012-04-19 LAB — COMPREHENSIVE METABOLIC PANEL
CO2: 26 mEq/L (ref 19–32)
Calcium: 9.3 mg/dL (ref 8.4–10.5)
Chloride: 106 mEq/L (ref 96–112)
Creat: 0.64 mg/dL (ref 0.50–1.10)
Glucose, Bld: 77 mg/dL (ref 70–99)
Total Bilirubin: 0.7 mg/dL (ref 0.3–1.2)
Total Protein: 6.9 g/dL (ref 6.0–8.3)

## 2012-04-19 LAB — LIPID PANEL
Cholesterol: 181 mg/dL (ref 0–200)
Total CHOL/HDL Ratio: 3.4 Ratio
Triglycerides: 84 mg/dL (ref <150)
VLDL: 17 mg/dL (ref 0–40)

## 2012-04-22 ENCOUNTER — Encounter: Payer: Self-pay | Admitting: Internal Medicine

## 2012-04-22 ENCOUNTER — Ambulatory Visit (INDEPENDENT_AMBULATORY_CARE_PROVIDER_SITE_OTHER): Payer: 59 | Admitting: Internal Medicine

## 2012-04-22 VITALS — BP 128/82 | HR 76 | Temp 99.6°F | Ht 67.5 in | Wt 241.0 lb

## 2012-04-22 DIAGNOSIS — F329 Major depressive disorder, single episode, unspecified: Secondary | ICD-10-CM

## 2012-04-22 DIAGNOSIS — F32A Depression, unspecified: Secondary | ICD-10-CM

## 2012-04-22 DIAGNOSIS — E785 Hyperlipidemia, unspecified: Secondary | ICD-10-CM

## 2012-04-22 DIAGNOSIS — E669 Obesity, unspecified: Secondary | ICD-10-CM

## 2012-04-22 DIAGNOSIS — E039 Hypothyroidism, unspecified: Secondary | ICD-10-CM

## 2012-04-22 DIAGNOSIS — Z0000 Encounter for general adult medical examination without abnormal findings: Secondary | ICD-10-CM

## 2012-04-22 LAB — POCT URINALYSIS DIPSTICK
Bilirubin, UA: NEGATIVE
Glucose, UA: NEGATIVE
Ketones, UA: NEGATIVE
Leukocytes, UA: NEGATIVE
Nitrite, UA: NEGATIVE
pH, UA: 5.5

## 2012-04-22 LAB — HEMOGLOBIN A1C: Mean Plasma Glucose: 111 mg/dL (ref <117)

## 2012-04-24 ENCOUNTER — Other Ambulatory Visit: Payer: Self-pay | Admitting: Internal Medicine

## 2012-04-24 DIAGNOSIS — Z1231 Encounter for screening mammogram for malignant neoplasm of breast: Secondary | ICD-10-CM

## 2012-05-09 ENCOUNTER — Ambulatory Visit (HOSPITAL_COMMUNITY)
Admission: RE | Admit: 2012-05-09 | Discharge: 2012-05-09 | Disposition: A | Discharge status: Home / Self Care | Payer: 59 | Source: Ambulatory Visit | Attending: Internal Medicine | Admitting: Internal Medicine

## 2012-05-09 DIAGNOSIS — Z1231 Encounter for screening mammogram for malignant neoplasm of breast: Secondary | ICD-10-CM | POA: Insufficient documentation
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
## 2012-06-09 NOTE — Patient Instructions (Addendum)
Continue same medications and return in one year. Mammogram annually. Diet exercise and lose weight.

## 2012-06-17 ENCOUNTER — Other Ambulatory Visit: Payer: Self-pay | Admitting: Internal Medicine

## 2012-06-24 ENCOUNTER — Other Ambulatory Visit: Payer: Self-pay | Admitting: Internal Medicine

## 2012-06-26 ENCOUNTER — Other Ambulatory Visit: Payer: Self-pay | Admitting: Internal Medicine

## 2012-06-28 ENCOUNTER — Other Ambulatory Visit: Payer: 59 | Admitting: Internal Medicine

## 2012-07-01 ENCOUNTER — Other Ambulatory Visit: Payer: Self-pay | Admitting: Internal Medicine

## 2012-07-04 ENCOUNTER — Other Ambulatory Visit: Payer: 59 | Admitting: Internal Medicine

## 2012-07-04 DIAGNOSIS — E039 Hypothyroidism, unspecified: Secondary | ICD-10-CM

## 2012-07-05 NOTE — Progress Notes (Signed)
Patient informed. 

## 2012-07-14 ENCOUNTER — Other Ambulatory Visit: Payer: Self-pay | Admitting: Internal Medicine

## 2012-07-17 ENCOUNTER — Other Ambulatory Visit: Payer: Self-pay | Admitting: Internal Medicine

## 2012-07-17 ENCOUNTER — Other Ambulatory Visit: Payer: Self-pay

## 2012-07-17 MED ORDER — SIMVASTATIN 20 MG PO TABS: 20.0000 mg | ORAL_TABLET | Freq: Every day | ORAL | Status: DC

## 2012-08-27 ENCOUNTER — Other Ambulatory Visit: Payer: Self-pay | Admitting: Internal Medicine

## 2012-10-28 ENCOUNTER — Other Ambulatory Visit: Payer: 59 | Admitting: Internal Medicine

## 2012-10-28 DIAGNOSIS — R7301 Impaired fasting glucose: Secondary | ICD-10-CM

## 2012-10-28 DIAGNOSIS — Z79899 Other long term (current) drug therapy: Secondary | ICD-10-CM

## 2012-10-28 DIAGNOSIS — E785 Hyperlipidemia, unspecified: Secondary | ICD-10-CM

## 2012-10-28 DIAGNOSIS — E039 Hypothyroidism, unspecified: Secondary | ICD-10-CM

## 2012-10-28 LAB — HEPATIC FUNCTION PANEL
ALT: 21 U/L (ref 0–35)
AST: 17 U/L (ref 0–37)
Alkaline Phosphatase: 69 U/L (ref 39–117)
Indirect Bilirubin: 0.5 mg/dL (ref 0.0–0.9)

## 2012-10-28 LAB — LIPID PANEL
Cholesterol: 191 mg/dL (ref 0–200)
HDL: 50 mg/dL (ref >39)
LDL Cholesterol: 122 mg/dL — ABNORMAL HIGH (ref 0–99)
Triglycerides: 94 mg/dL (ref <150)

## 2012-10-29 ENCOUNTER — Other Ambulatory Visit: Payer: 59 | Admitting: Internal Medicine

## 2012-10-29 LAB — TSH: TSH: 2.88 u[IU]/mL (ref 0.350–4.500)

## 2012-10-29 LAB — HEMOGLOBIN A1C
Hgb A1c MFr Bld: 5.3 % (ref <5.7)
Mean Plasma Glucose: 105 mg/dL (ref <117)

## 2012-10-31 ENCOUNTER — Ambulatory Visit (INDEPENDENT_AMBULATORY_CARE_PROVIDER_SITE_OTHER): Payer: 59 | Admitting: Internal Medicine

## 2012-10-31 ENCOUNTER — Encounter: Payer: Self-pay | Admitting: Internal Medicine

## 2012-10-31 VITALS — BP 124/84 | HR 68 | Temp 99.0°F | Wt 248.0 lb

## 2012-10-31 DIAGNOSIS — E7800 Pure hypercholesterolemia, unspecified: Secondary | ICD-10-CM

## 2012-10-31 DIAGNOSIS — E039 Hypothyroidism, unspecified: Secondary | ICD-10-CM

## 2012-10-31 DIAGNOSIS — F32A Depression, unspecified: Secondary | ICD-10-CM

## 2012-10-31 DIAGNOSIS — F419 Anxiety disorder, unspecified: Secondary | ICD-10-CM

## 2012-10-31 DIAGNOSIS — F341 Dysthymic disorder: Secondary | ICD-10-CM

## 2012-10-31 DIAGNOSIS — E669 Obesity, unspecified: Secondary | ICD-10-CM

## 2012-10-31 NOTE — Progress Notes (Signed)
  Subjective:    Patient ID: Brandy Price, female    DOB: 01/04/1971, 42 y.o.   MRN: [id number]  HPI  42 year old white female with history of obesity, hypothyroidism, hyperlipidemia, anxiety, and depression. She is on Zocor 20 mg daily, Zoloft 100 mg daily and thyroid replacement therapy. She was started on Zocor and 2012. History of postpartum depression 2005 that persisted with a divorce and raising 2 children. Not motivated to diet and exercise. Talked with her about this today. Really needs to get serious about losing some weight. LDL checked on July 28 shows a value of 122 and previously was 111 6 months ago. TSH is within normal limits.      Review of Systems     Objective:   Physical Exam Affect is normal. Seems cheerful. No thyromegaly       Assessment & Plan:  Obesity-needs to get serious about diet exercise  Hyperlipidemia-slight increase in LDL from 6 months ago from 111-122  Hypothyroidism treated with thyroid replacement therapy. TSH within normal limits  History of anxiety and depression-treated with Zoloft  Plan: Continue same medications and return in 6 months. Try to get patient to walk 30 minutes several times weekly.  Spent 25 minutes speaking with patient about obesity diet and exercise, anxiety depression hyperlipidemia

## 2012-12-18 ENCOUNTER — Other Ambulatory Visit: Payer: Self-pay

## 2012-12-18 MED ORDER — LEVOTHYROXINE SODIUM 100 MCG PO TABS: 100.0000 ug | ORAL_TABLET | Freq: Every day | ORAL | Status: DC

## 2013-03-08 ENCOUNTER — Other Ambulatory Visit: Payer: Self-pay | Admitting: Internal Medicine

## 2013-04-03 ENCOUNTER — Other Ambulatory Visit: Payer: Self-pay | Admitting: Internal Medicine

## 2013-04-21 ENCOUNTER — Other Ambulatory Visit: Payer: Self-pay

## 2013-04-21 MED ORDER — SERTRALINE HCL 100 MG PO TABS: 100.0000 mg | ORAL_TABLET | Freq: Every day | ORAL | Status: DC

## 2013-04-21 MED ORDER — SIMVASTATIN 20 MG PO TABS: 20.0000 mg | ORAL_TABLET | Freq: Every day | ORAL | Status: DC

## 2013-04-28 ENCOUNTER — Other Ambulatory Visit: Payer: 59 | Admitting: Internal Medicine

## 2013-05-02 ENCOUNTER — Encounter: Payer: 59 | Admitting: Internal Medicine

## 2013-05-15 ENCOUNTER — Other Ambulatory Visit: Payer: 59 | Admitting: Internal Medicine

## 2013-05-16 ENCOUNTER — Encounter: Payer: 59 | Admitting: Internal Medicine

## 2013-08-13 ENCOUNTER — Other Ambulatory Visit: Payer: Self-pay | Admitting: Internal Medicine

## 2013-08-13 DIAGNOSIS — Z1231 Encounter for screening mammogram for malignant neoplasm of breast: Secondary | ICD-10-CM

## 2013-08-19 ENCOUNTER — Ambulatory Visit (HOSPITAL_COMMUNITY): Payer: 59

## 2013-08-26 ENCOUNTER — Ambulatory Visit (HOSPITAL_COMMUNITY)
Admission: RE | Admit: 2013-08-26 | Discharge: 2013-08-26 | Disposition: A | Discharge status: Home / Self Care | Payer: Commercial Managed Care - PPO | Source: Ambulatory Visit | Attending: Internal Medicine | Admitting: Internal Medicine

## 2013-08-26 DIAGNOSIS — Z1231 Encounter for screening mammogram for malignant neoplasm of breast: Secondary | ICD-10-CM | POA: Insufficient documentation

## 2013-08-28 ENCOUNTER — Other Ambulatory Visit: Payer: Self-pay

## 2013-08-28 MED ORDER — LEVOTHYROXINE SODIUM 100 MCG PO TABS: 100.0000 ug | ORAL_TABLET | Freq: Every day | ORAL | Status: DC

## 2013-11-02 ENCOUNTER — Other Ambulatory Visit: Payer: Self-pay | Admitting: Internal Medicine

## 2013-11-02 NOTE — Telephone Encounter (Signed)
Not seen since July 2014. Needs CPE. Please book before refilling.

## 2013-11-03 ENCOUNTER — Other Ambulatory Visit: Payer: Self-pay

## 2013-12-11 ENCOUNTER — Other Ambulatory Visit: Payer: Commercial Managed Care - PPO | Admitting: Internal Medicine

## 2013-12-11 DIAGNOSIS — Z130 Encounter for screening for diseases of the blood and blood-forming organs and certain disorders involving the immune mechanism: Secondary | ICD-10-CM

## 2013-12-11 DIAGNOSIS — E669 Obesity, unspecified: Secondary | ICD-10-CM

## 2013-12-11 DIAGNOSIS — Z13228 Encounter for screening for other metabolic disorders: Secondary | ICD-10-CM

## 2013-12-11 DIAGNOSIS — Z0000 Encounter for general adult medical examination without abnormal findings: Secondary | ICD-10-CM

## 2013-12-11 DIAGNOSIS — Z1329 Encounter for screening for other suspected endocrine disorder: Secondary | ICD-10-CM

## 2013-12-11 DIAGNOSIS — E785 Hyperlipidemia, unspecified: Secondary | ICD-10-CM

## 2013-12-11 DIAGNOSIS — E039 Hypothyroidism, unspecified: Secondary | ICD-10-CM

## 2013-12-12 ENCOUNTER — Ambulatory Visit (INDEPENDENT_AMBULATORY_CARE_PROVIDER_SITE_OTHER): Payer: Commercial Managed Care - PPO | Admitting: Internal Medicine

## 2013-12-12 ENCOUNTER — Encounter: Payer: Self-pay | Admitting: Internal Medicine

## 2013-12-12 ENCOUNTER — Other Ambulatory Visit (HOSPITAL_COMMUNITY)
Admission: RE | Admit: 2013-12-12 | Discharge: 2013-12-12 | Disposition: A | Discharge status: Home / Self Care | Payer: Commercial Managed Care - PPO | Source: Ambulatory Visit | Attending: Internal Medicine | Admitting: Internal Medicine

## 2013-12-12 VITALS — BP 122/80 | HR 100 | Ht 67.0 in | Wt 250.0 lb

## 2013-12-12 DIAGNOSIS — E785 Hyperlipidemia, unspecified: Secondary | ICD-10-CM

## 2013-12-12 DIAGNOSIS — F3289 Other specified depressive episodes: Secondary | ICD-10-CM

## 2013-12-12 DIAGNOSIS — E039 Hypothyroidism, unspecified: Secondary | ICD-10-CM

## 2013-12-12 DIAGNOSIS — F329 Major depressive disorder, single episode, unspecified: Secondary | ICD-10-CM

## 2013-12-12 DIAGNOSIS — Z01419 Encounter for gynecological examination (general) (routine) without abnormal findings: Secondary | ICD-10-CM | POA: Diagnosis present

## 2013-12-12 DIAGNOSIS — F32A Depression, unspecified: Secondary | ICD-10-CM

## 2013-12-12 DIAGNOSIS — E669 Obesity, unspecified: Secondary | ICD-10-CM

## 2013-12-12 DIAGNOSIS — Z0000 Encounter for general adult medical examination without abnormal findings: Secondary | ICD-10-CM

## 2013-12-12 LAB — CBC WITH DIFFERENTIAL/PLATELET
BASOS PCT: 1 % (ref 0–1)
Basophils Absolute: 0.1 10*3/uL (ref 0.0–0.1)
EOS ABS: 0.1 10*3/uL (ref 0.0–0.7)
Eosinophils Relative: 2 % (ref 0–5)
HEMATOCRIT: 40.9 % (ref 36.0–46.0)
HEMOGLOBIN: 13.8 g/dL (ref 12.0–15.0)
LYMPHS ABS: 2 10*3/uL (ref 0.7–4.0)
Lymphocytes Relative: 28 % (ref 12–46)
MCH: 29.9 pg (ref 26.0–34.0)
MCHC: 33.7 g/dL (ref 30.0–36.0)
MCV: 88.7 fL (ref 78.0–100.0)
Monocytes Absolute: 0.5 10*3/uL (ref 0.1–1.0)
Monocytes Relative: 7 % (ref 3–12)
NEUTROS ABS: 4.3 10*3/uL (ref 1.7–7.7)
NEUTROS PCT: 62 % (ref 43–77)
Platelets: 370 10*3/uL (ref 150–400)
RBC: 4.61 MIL/uL (ref 3.87–5.11)
RDW: 13.6 % (ref 11.5–15.5)
WBC: 7 10*3/uL (ref 4.0–10.5)

## 2013-12-12 LAB — COMPREHENSIVE METABOLIC PANEL
ALBUMIN: 4.1 g/dL (ref 3.5–5.2)
ALK PHOS: 80 U/L (ref 39–117)
ALT: 19 U/L (ref 0–35)
AST: 17 U/L (ref 0–37)
BUN: 11 mg/dL (ref 6–23)
CO2: 26 mEq/L (ref 19–32)
Calcium: 9.3 mg/dL (ref 8.4–10.5)
Chloride: 101 mEq/L (ref 96–112)
Creat: 0.69 mg/dL (ref 0.50–1.10)
Glucose, Bld: 87 mg/dL (ref 70–99)
POTASSIUM: 4 meq/L (ref 3.5–5.3)
SODIUM: 138 meq/L (ref 135–145)
TOTAL PROTEIN: 6.8 g/dL (ref 6.0–8.3)
Total Bilirubin: 0.7 mg/dL (ref 0.2–1.2)

## 2013-12-12 LAB — POCT URINALYSIS DIPSTICK
Bilirubin, UA: NEGATIVE
GLUCOSE UA: NEGATIVE
Ketones, UA: NEGATIVE
LEUKOCYTES UA: NEGATIVE
NITRITE UA: NEGATIVE
Protein, UA: NEGATIVE
RBC UA: NEGATIVE
Spec Grav, UA: 1.025
UROBILINOGEN UA: NEGATIVE
pH, UA: 5

## 2013-12-12 LAB — LIPID PANEL
Cholesterol: 175 mg/dL (ref 0–200)
HDL: 52 mg/dL (ref >39)
LDL CALC: 105 mg/dL — AB (ref 0–99)
Total CHOL/HDL Ratio: 3.4 Ratio
Triglycerides: 91 mg/dL (ref <150)
VLDL: 18 mg/dL (ref 0–40)

## 2013-12-12 LAB — VITAMIN D 25 HYDROXY (VIT D DEFICIENCY, FRACTURES): Vit D, 25-Hydroxy: 52 ng/mL (ref 30–89)

## 2013-12-12 LAB — TSH: TSH: 3.527 u[IU]/mL (ref 0.350–4.500)

## 2013-12-12 MED ORDER — SERTRALINE HCL 100 MG PO TABS: 100.0000 mg | ORAL_TABLET | Freq: Every day | ORAL | Status: DC

## 2013-12-12 MED ORDER — LEVOTHYROXINE SODIUM 112 MCG PO TABS: 112.0000 ug | ORAL_TABLET | Freq: Every day | ORAL | Status: DC

## 2013-12-12 MED ORDER — SIMVASTATIN 20 MG PO TABS: 20.0000 mg | ORAL_TABLET | Freq: Every day | ORAL | Status: DC

## 2013-12-12 MED ORDER — PROMETHAZINE HCL 25 MG PO TABS: 25.0000 mg | ORAL_TABLET | ORAL | Status: DC | PRN
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
## 2013-12-16 LAB — CYTOLOGY - PAP

## 2014-02-22 ENCOUNTER — Encounter: Payer: Self-pay | Admitting: Internal Medicine

## 2014-02-22 NOTE — Patient Instructions (Signed)
Continue same medications and return in one year. Encouraged diet exercise and weight loss.

## 2014-03-24 ENCOUNTER — Other Ambulatory Visit: Payer: Commercial Managed Care - PPO | Admitting: Internal Medicine

## 2014-03-24 DIAGNOSIS — E039 Hypothyroidism, unspecified: Secondary | ICD-10-CM

## 2014-03-24 LAB — TSH: TSH: 3.092 u[IU]/mL (ref 0.350–4.500)

## 2014-03-30 ENCOUNTER — Telehealth: Payer: Self-pay | Admitting: *Deleted

## 2014-03-31 NOTE — Telephone Encounter (Signed)
Not totally compliant with thyroid replacement therapy. Missed 3 doses before TSH Rechecked recently. MUST take daily and return mid January for TSH only. Appt made for Monday Jan 18th

## 2014-04-01 ENCOUNTER — Telehealth: Payer: Self-pay | Admitting: *Deleted

## 2014-04-02 NOTE — Telephone Encounter (Signed)
Spoke with patient she states she did miss several doses but she did speak with Dr Lenord FellersBaxley and she will be more compliant with her medications and we will recheck her levels at a later date

## 2014-04-17 ENCOUNTER — Other Ambulatory Visit: Payer: Commercial Managed Care - PPO | Admitting: Internal Medicine

## 2014-04-17 DIAGNOSIS — E039 Hypothyroidism, unspecified: Secondary | ICD-10-CM

## 2014-04-17 LAB — TSH: TSH: 2.82 u[IU]/mL (ref 0.350–4.500)

## 2014-04-20 ENCOUNTER — Telehealth: Payer: Self-pay | Admitting: *Deleted

## 2014-04-20 NOTE — Telephone Encounter (Signed)
Reviewed lab results with patient . Instructed her to continue same dose of levothyroxine as directed

## 2014-04-27 ENCOUNTER — Other Ambulatory Visit: Payer: Self-pay | Admitting: *Deleted

## 2014-04-27 MED ORDER — LEVOTHYROXINE SODIUM 112 MCG PO TABS: 112.0000 ug | ORAL_TABLET | Freq: Every day | ORAL | Status: DC

## 2014-04-27 NOTE — Telephone Encounter (Signed)
Refills on synthroid sent to pharmacy

## 2014-07-20 ENCOUNTER — Other Ambulatory Visit: Payer: Self-pay | Admitting: Internal Medicine

## 2014-10-20 ENCOUNTER — Other Ambulatory Visit: Payer: Self-pay | Admitting: Internal Medicine

## 2014-10-20 DIAGNOSIS — Z1231 Encounter for screening mammogram for malignant neoplasm of breast: Secondary | ICD-10-CM

## 2014-10-26 ENCOUNTER — Other Ambulatory Visit: Payer: Self-pay | Admitting: Internal Medicine

## 2014-11-03 ENCOUNTER — Ambulatory Visit (HOSPITAL_COMMUNITY)
Admission: RE | Admit: 2014-11-03 | Discharge: 2014-11-03 | Disposition: A | Discharge status: Home / Self Care | Payer: Commercial Managed Care - PPO | Source: Ambulatory Visit | Attending: Internal Medicine | Admitting: Internal Medicine

## 2014-11-03 DIAGNOSIS — Z1231 Encounter for screening mammogram for malignant neoplasm of breast: Secondary | ICD-10-CM | POA: Insufficient documentation

## 2014-12-22 ENCOUNTER — Other Ambulatory Visit: Payer: Self-pay | Admitting: Internal Medicine

## 2015-01-18 ENCOUNTER — Other Ambulatory Visit: Payer: Commercial Managed Care - PPO | Admitting: Internal Medicine

## 2015-01-18 DIAGNOSIS — E785 Hyperlipidemia, unspecified: Secondary | ICD-10-CM

## 2015-01-18 DIAGNOSIS — Z1321 Encounter for screening for nutritional disorder: Secondary | ICD-10-CM

## 2015-01-18 DIAGNOSIS — E039 Hypothyroidism, unspecified: Secondary | ICD-10-CM

## 2015-01-18 DIAGNOSIS — Z13220 Encounter for screening for lipoid disorders: Secondary | ICD-10-CM

## 2015-01-18 DIAGNOSIS — Z1329 Encounter for screening for other suspected endocrine disorder: Secondary | ICD-10-CM

## 2015-01-18 DIAGNOSIS — Z130 Encounter for screening for diseases of the blood and blood-forming organs and certain disorders involving the immune mechanism: Secondary | ICD-10-CM

## 2015-01-18 DIAGNOSIS — Z0000 Encounter for general adult medical examination without abnormal findings: Secondary | ICD-10-CM

## 2015-01-18 LAB — COMPLETE METABOLIC PANEL WITH GFR
ALT: 28 U/L (ref 6–29)
AST: 18 U/L (ref 10–35)
Albumin: 4.1 g/dL (ref 3.6–5.1)
Alkaline Phosphatase: 79 U/L (ref 33–115)
BUN: 14 mg/dL (ref 7–25)
CALCIUM: 9.5 mg/dL (ref 8.6–10.2)
CHLORIDE: 105 mmol/L (ref 98–110)
CO2: 25 mmol/L (ref 20–31)
CREATININE: 0.7 mg/dL (ref 0.50–1.10)
GFR, Est African American: >89 mL/min (ref >=60)
GFR, Est Non African American: >89 mL/min (ref >=60)
Glucose, Bld: 90 mg/dL (ref 65–99)
Potassium: 4.8 mmol/L (ref 3.5–5.3)
Sodium: 140 mmol/L (ref 135–146)
Total Bilirubin: 0.7 mg/dL (ref 0.2–1.2)
Total Protein: 7.4 g/dL (ref 6.1–8.1)

## 2015-01-18 LAB — LIPID PANEL
Cholesterol: 227 mg/dL — ABNORMAL HIGH (ref 125–200)
HDL: 52 mg/dL (ref >=46)
LDL Cholesterol: 158 mg/dL — ABNORMAL HIGH (ref <130)
Total CHOL/HDL Ratio: 4.4 Ratio (ref <=5.0)
Triglycerides: 86 mg/dL (ref <150)
VLDL: 17 mg/dL (ref <30)

## 2015-01-18 LAB — TSH: TSH: 3.477 u[IU]/mL (ref 0.350–4.500)

## 2015-01-19 ENCOUNTER — Other Ambulatory Visit: Payer: Commercial Managed Care - PPO | Admitting: Internal Medicine

## 2015-01-19 LAB — VITAMIN D 25 HYDROXY (VIT D DEFICIENCY, FRACTURES): Vit D, 25-Hydroxy: 34 ng/mL (ref 30–100)

## 2015-01-19 LAB — CBC WITH DIFFERENTIAL/PLATELET
BASOS ABS: 0.1 10*3/uL (ref 0.0–0.1)
Basophils Relative: 1 % (ref 0–1)
EOS PCT: 1 % (ref 0–5)
Eosinophils Absolute: 0.1 10*3/uL (ref 0.0–0.7)
HEMATOCRIT: 42.1 % (ref 36.0–46.0)
Hemoglobin: 14.4 g/dL (ref 12.0–15.0)
LYMPHS PCT: 28 % (ref 12–46)
Lymphs Abs: 2.3 10*3/uL (ref 0.7–4.0)
MCH: 30.5 pg (ref 26.0–34.0)
MCHC: 34.2 g/dL (ref 30.0–36.0)
MCV: 89.2 fL (ref 78.0–100.0)
MPV: 9.5 fL (ref 8.6–12.4)
Monocytes Absolute: 0.6 10*3/uL (ref 0.1–1.0)
Monocytes Relative: 8 % (ref 3–12)
NEUTROS PCT: 62 % (ref 43–77)
Neutro Abs: 5 10*3/uL (ref 1.7–7.7)
PLATELETS: 400 10*3/uL (ref 150–400)
RBC: 4.72 MIL/uL (ref 3.87–5.11)
RDW: 13.6 % (ref 11.5–15.5)
WBC: 8.1 10*3/uL (ref 4.0–10.5)

## 2015-01-21 ENCOUNTER — Ambulatory Visit (INDEPENDENT_AMBULATORY_CARE_PROVIDER_SITE_OTHER): Payer: Commercial Managed Care - PPO | Admitting: Internal Medicine

## 2015-01-21 ENCOUNTER — Encounter: Payer: Self-pay | Admitting: Internal Medicine

## 2015-01-21 VITALS — BP 134/72 | HR 74 | Temp 98.0°F | Resp 18 | Ht 67.0 in | Wt 249.0 lb

## 2015-01-21 DIAGNOSIS — E669 Obesity, unspecified: Secondary | ICD-10-CM | POA: Diagnosis not present

## 2015-01-21 DIAGNOSIS — Z0000 Encounter for general adult medical examination without abnormal findings: Secondary | ICD-10-CM | POA: Diagnosis not present

## 2015-01-21 DIAGNOSIS — E785 Hyperlipidemia, unspecified: Secondary | ICD-10-CM

## 2015-01-21 DIAGNOSIS — E039 Hypothyroidism, unspecified: Secondary | ICD-10-CM

## 2015-01-21 DIAGNOSIS — Z8659 Personal history of other mental and behavioral disorders: Secondary | ICD-10-CM | POA: Diagnosis not present

## 2015-01-21 LAB — POCT URINALYSIS DIPSTICK
Bilirubin, UA: NEGATIVE
GLUCOSE UA: NEGATIVE
Ketones, UA: NEGATIVE
LEUKOCYTES UA: NEGATIVE
NITRITE UA: NEGATIVE
Protein, UA: NEGATIVE
Spec Grav, UA: 1.025
UROBILINOGEN UA: 0.2
pH, UA: 6

## 2015-01-21 MED ORDER — PROMETHAZINE HCL 25 MG PO TABS: 25.0000 mg | ORAL_TABLET | Freq: Three times a day (TID) | ORAL | Status: DC | PRN

## 2015-01-21 MED ORDER — SERTRALINE HCL 100 MG PO TABS: 100.0000 mg | ORAL_TABLET | Freq: Every day | ORAL | Status: DC

## 2015-01-21 MED ORDER — LEVOTHYROXINE SODIUM 125 MCG PO CAPS
1.0000 | ORAL_CAPSULE | Freq: Every day | ORAL | Status: DC
Start: 2015-01-21 — End: 2015-04-19

## 2015-01-21 MED ORDER — SIMVASTATIN 20 MG PO TABS: 20.0000 mg | ORAL_TABLET | Freq: Every day | ORAL | Status: DC

## 2015-02-01 ENCOUNTER — Encounter: Payer: Self-pay | Admitting: Internal Medicine
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
## 2015-02-01 NOTE — Patient Instructions (Signed)
Increase Synthroid to 0.125 mg daily and return in December for TSH and fasting lipid panel only. Take lipid-lowering medicine daily. Please try to diet and exercise.

## 2015-03-05 ENCOUNTER — Other Ambulatory Visit: Payer: Commercial Managed Care - PPO | Admitting: Internal Medicine

## 2015-03-05 DIAGNOSIS — E039 Hypothyroidism, unspecified: Secondary | ICD-10-CM

## 2015-03-05 DIAGNOSIS — E785 Hyperlipidemia, unspecified: Secondary | ICD-10-CM

## 2015-03-05 LAB — LIPID PANEL
Cholesterol: 190 mg/dL (ref 125–200)
HDL: 53 mg/dL (ref >=46)
LDL CALC: 119 mg/dL (ref <130)
Total CHOL/HDL Ratio: 3.6 Ratio (ref <=5.0)
Triglycerides: 88 mg/dL (ref <150)
VLDL: 18 mg/dL (ref <30)

## 2015-03-05 LAB — TSH: TSH: 2.827 u[IU]/mL (ref 0.350–4.500)

## 2015-03-24 ENCOUNTER — Other Ambulatory Visit: Payer: Self-pay | Admitting: Internal Medicine

## 2015-04-14 ENCOUNTER — Other Ambulatory Visit: Payer: Self-pay | Admitting: Internal Medicine

## 2015-04-15 ENCOUNTER — Other Ambulatory Visit: Payer: Self-pay

## 2015-04-15 MED ORDER — LEVOTHYROXINE SODIUM 125 MCG PO CAPS: 1.0000 | ORAL_CAPSULE | Freq: Every day | ORAL | Status: DC

## 2015-04-15 NOTE — Telephone Encounter (Signed)
Fax request received 04/15/15

## 2015-04-19 ENCOUNTER — Other Ambulatory Visit: Payer: Self-pay | Admitting: Internal Medicine

## 2015-04-19 NOTE — Telephone Encounter (Signed)
Cannot close out

## 2015-06-03 ENCOUNTER — Other Ambulatory Visit: Payer: Self-pay | Admitting: Internal Medicine

## 2015-06-13 ENCOUNTER — Other Ambulatory Visit: Payer: Self-pay | Admitting: Internal Medicine

## 2015-09-11 ENCOUNTER — Other Ambulatory Visit: Payer: Self-pay | Admitting: Internal Medicine

## 2015-10-25 NOTE — Progress Notes (Signed)
Erroneous encounter

## 2015-10-26 ENCOUNTER — Encounter: Payer: Self-pay | Admitting: Internal Medicine

## 2015-10-26 ENCOUNTER — Ambulatory Visit (INDEPENDENT_AMBULATORY_CARE_PROVIDER_SITE_OTHER): Payer: Commercial Managed Care - PPO | Admitting: Internal Medicine

## 2015-10-26 VITALS — BP 132/62 | HR 72 | Temp 99.3°F | Ht 67.0 in | Wt 255.0 lb

## 2015-10-26 DIAGNOSIS — L237 Allergic contact dermatitis due to plants, except food: Secondary | ICD-10-CM | POA: Diagnosis not present

## 2015-10-26 DIAGNOSIS — E039 Hypothyroidism, unspecified: Secondary | ICD-10-CM

## 2015-10-26 DIAGNOSIS — F329 Major depressive disorder, single episode, unspecified: Secondary | ICD-10-CM | POA: Diagnosis not present

## 2015-10-26 DIAGNOSIS — F32A Depression, unspecified: Secondary | ICD-10-CM

## 2015-10-26 MED ORDER — METHYLPREDNISOLONE 4 MG PO TABS: ORAL_TABLET | ORAL | 0 refills | Status: DC

## 2015-10-26 MED ORDER — METHYLPREDNISOLONE ACETATE 80 MG/ML IJ SUSP
80.0000 mg | Freq: Once | INTRAMUSCULAR | Status: AC
  Administered 2015-10-26: 80 mg via INTRAMUSCULAR

## 2015-10-26 NOTE — Progress Notes (Signed)
   Subjective:    Patient ID: Brandy Price, female    DOB: 1970/07/29, 45 y.o.   MRN: [id number]  HPI 45 year old female with history of hypothyroidism and depression in today with contact dermatitis present for about a week. She had a telemedicine visit and was prescribed a six-day tapering course of prednisone around July 18. Symptoms have not improved. She has swelling and redness about her right eye. Multiple lesions on both arms.    Review of Systems as above     Objective:   Physical Exam Right periorbital area is red and swollen without drainage. Multiple linear vesicular lesions on both arms       Assessment & Plan:  Contact dermatitis-lesions are consistent with poison ivy  History of hypothyroidism  History of depression  Plan: She is a hardy had a short tapering course of prednisone and has not improved. Was given Depo-Medrol IM in the office and started on Medrol 4 mg tablets. She'll take 24 mg initially and decreased by 4 mg every 2 days for 12 day tapering course. Is to apply calamine lotion to lesions.

## 2015-10-26 NOTE — Patient Instructions (Signed)
Depo-Medrol 80 mg IM. Medrol 4 mg (#42) starting with 24 mg and decreasing by 4 mg every 2 days over 12 days

## 2015-12-01 ENCOUNTER — Other Ambulatory Visit: Payer: Self-pay | Admitting: Internal Medicine

## 2015-12-01 NOTE — Telephone Encounter (Signed)
Must book appointment for late October or early November before refilling until then. Please check appointment book.

## 2015-12-03 NOTE — Telephone Encounter (Signed)
Called patient to offer: Lab appt on: 10/27 @ 9:00  am CPE appt on: 10/30 @ 2:00 pm   No answer. Will try later.

## 2015-12-08 ENCOUNTER — Encounter: Payer: Self-pay | Admitting: Internal Medicine

## 2015-12-08 NOTE — Telephone Encounter (Signed)
Refill levothyroxin 0.125 mg #90 with no refill to [facility]. Has an appointment October 30 for physical exam and lab appointment October 27.

## 2015-12-09 MED ORDER — LEVOTHYROXINE SODIUM 125 MCG PO TABS: ORAL_TABLET | ORAL | 0 refills | Status: DC

## 2015-12-09 NOTE — Addendum Note (Signed)
Addended by: Doree BarthelLOWE, Kanya Potteiger on: 12/09/2015 11:29 AM   Modules accepted: Orders

## 2015-12-09 NOTE — Telephone Encounter (Signed)
Done

## 2016-01-28 ENCOUNTER — Other Ambulatory Visit: Payer: Commercial Managed Care - PPO | Admitting: Internal Medicine

## 2016-01-31 ENCOUNTER — Encounter: Payer: Commercial Managed Care - PPO | Admitting: Internal Medicine

## 2016-02-08 ENCOUNTER — Other Ambulatory Visit: Payer: Self-pay | Admitting: Internal Medicine

## 2016-02-26 ENCOUNTER — Other Ambulatory Visit: Payer: Self-pay | Admitting: Internal Medicine

## 2016-03-01 ENCOUNTER — Other Ambulatory Visit: Payer: Self-pay | Admitting: Internal Medicine

## 2016-03-01 DIAGNOSIS — Z1231 Encounter for screening mammogram for malignant neoplasm of breast: Secondary | ICD-10-CM

## 2016-03-28 ENCOUNTER — Ambulatory Visit
Admission: RE | Admit: 2016-03-28 | Discharge: 2016-03-28 | Disposition: A | Discharge status: Home / Self Care | Payer: Commercial Managed Care - PPO | Source: Ambulatory Visit | Attending: Internal Medicine | Admitting: Internal Medicine

## 2016-03-28 DIAGNOSIS — Z1231 Encounter for screening mammogram for malignant neoplasm of breast: Secondary | ICD-10-CM

## 2016-03-31 ENCOUNTER — Other Ambulatory Visit: Payer: Commercial Managed Care - PPO | Admitting: Internal Medicine

## 2016-03-31 DIAGNOSIS — F329 Major depressive disorder, single episode, unspecified: Secondary | ICD-10-CM

## 2016-03-31 DIAGNOSIS — F32A Depression, unspecified: Secondary | ICD-10-CM

## 2016-03-31 DIAGNOSIS — F419 Anxiety disorder, unspecified: Secondary | ICD-10-CM

## 2016-03-31 DIAGNOSIS — Z0000 Encounter for general adult medical examination without abnormal findings: Secondary | ICD-10-CM

## 2016-03-31 DIAGNOSIS — E785 Hyperlipidemia, unspecified: Secondary | ICD-10-CM

## 2016-03-31 DIAGNOSIS — E039 Hypothyroidism, unspecified: Secondary | ICD-10-CM

## 2016-03-31 LAB — CBC WITH DIFFERENTIAL/PLATELET
BASOS ABS: 62 {cells}/uL (ref 0–200)
Basophils Relative: 1 %
Eosinophils Absolute: 124 cells/uL (ref 15–500)
Eosinophils Relative: 2 %
HCT: 42.8 % (ref 35.0–45.0)
HEMOGLOBIN: 14 g/dL (ref 11.7–15.5)
LYMPHS ABS: 1674 {cells}/uL (ref 850–3900)
Lymphocytes Relative: 27 %
MCH: 29.8 pg (ref 27.0–33.0)
MCHC: 32.7 g/dL (ref 32.0–36.0)
MCV: 91.1 fL (ref 80.0–100.0)
MPV: 9.8 fL (ref 7.5–12.5)
Monocytes Absolute: 496 cells/uL (ref 200–950)
Monocytes Relative: 8 %
NEUTROS PCT: 62 %
Neutro Abs: 3844 cells/uL (ref 1500–7800)
Platelets: 369 10*3/uL (ref 140–400)
RBC: 4.7 MIL/uL (ref 3.80–5.10)
RDW: 13 % (ref 11.0–15.0)
WBC: 6.2 10*3/uL (ref 3.8–10.8)

## 2016-03-31 LAB — COMPLETE METABOLIC PANEL WITH GFR
ALBUMIN: 3.8 g/dL (ref 3.6–5.1)
ALK PHOS: 89 U/L (ref 33–115)
ALT: 33 U/L — ABNORMAL HIGH (ref 6–29)
AST: 24 U/L (ref 10–35)
BUN: 12 mg/dL (ref 7–25)
CALCIUM: 9 mg/dL (ref 8.6–10.2)
CO2: 27 mmol/L (ref 20–31)
Chloride: 103 mmol/L (ref 98–110)
Creat: 0.57 mg/dL (ref 0.50–1.10)
GFR, Est African American: >89 mL/min (ref >=60)
Glucose, Bld: 100 mg/dL — ABNORMAL HIGH (ref 65–99)
POTASSIUM: 4.6 mmol/L (ref 3.5–5.3)
SODIUM: 138 mmol/L (ref 135–146)
Total Bilirubin: 0.6 mg/dL (ref 0.2–1.2)
Total Protein: 6.6 g/dL (ref 6.1–8.1)

## 2016-03-31 LAB — LIPID PANEL
CHOL/HDL RATIO: 3.8 ratio (ref <5.0)
CHOLESTEROL: 195 mg/dL (ref <200)
HDL: 52 mg/dL (ref >50)
LDL Cholesterol: 118 mg/dL — ABNORMAL HIGH (ref <100)
TRIGLYCERIDES: 127 mg/dL (ref <150)
VLDL: 25 mg/dL (ref <30)

## 2016-03-31 LAB — TSH: TSH: 2.97 mIU/L

## 2016-04-01 LAB — VITAMIN D 25 HYDROXY (VIT D DEFICIENCY, FRACTURES): Vit D, 25-Hydroxy: 42 ng/mL (ref 30–100)

## 2016-04-07 ENCOUNTER — Encounter: Payer: Self-pay | Admitting: Internal Medicine

## 2016-04-07 ENCOUNTER — Encounter: Payer: Commercial Managed Care - PPO | Admitting: Internal Medicine

## 2016-04-11 ENCOUNTER — Other Ambulatory Visit (HOSPITAL_COMMUNITY)
Admission: RE | Admit: 2016-04-11 | Discharge: 2016-04-11 | Disposition: A | Discharge status: Home / Self Care | Payer: Commercial Managed Care - PPO | Source: Ambulatory Visit | Attending: Internal Medicine | Admitting: Internal Medicine

## 2016-04-11 ENCOUNTER — Encounter: Payer: Self-pay | Admitting: Internal Medicine

## 2016-04-11 ENCOUNTER — Ambulatory Visit (INDEPENDENT_AMBULATORY_CARE_PROVIDER_SITE_OTHER): Payer: Commercial Managed Care - PPO | Admitting: Internal Medicine

## 2016-04-11 VITALS — BP 132/82 | HR 78 | Temp 99.8°F | Ht 68.0 in | Wt 260.0 lb

## 2016-04-11 DIAGNOSIS — E7801 Familial hypercholesterolemia: Secondary | ICD-10-CM

## 2016-04-11 DIAGNOSIS — Z0000 Encounter for general adult medical examination without abnormal findings: Secondary | ICD-10-CM

## 2016-04-11 DIAGNOSIS — E038 Other specified hypothyroidism: Secondary | ICD-10-CM

## 2016-04-11 DIAGNOSIS — Z6839 Body mass index (BMI) 39.0-39.9, adult: Secondary | ICD-10-CM | POA: Diagnosis not present

## 2016-04-11 DIAGNOSIS — Z01419 Encounter for gynecological examination (general) (routine) without abnormal findings: Secondary | ICD-10-CM | POA: Insufficient documentation

## 2016-04-11 DIAGNOSIS — Z8659 Personal history of other mental and behavioral disorders: Secondary | ICD-10-CM | POA: Diagnosis not present

## 2016-04-11 LAB — POCT URINALYSIS DIPSTICK
Bilirubin, UA: NEGATIVE
Blood, UA: NEGATIVE
Glucose, UA: NEGATIVE
Ketones, UA: NEGATIVE
Leukocytes, UA: NEGATIVE
Nitrite, UA: NEGATIVE
Protein, UA: NEGATIVE
Spec Grav, UA: 1.02
Urobilinogen, UA: NEGATIVE
pH, UA: 6

## 2016-04-11 MED ORDER — PROMETHAZINE HCL 25 MG PO TABS: 25.0000 mg | ORAL_TABLET | Freq: Three times a day (TID) | ORAL | 1 refills | Status: DC | PRN

## 2016-04-11 MED ORDER — SIMVASTATIN 20 MG PO TABS: 20.0000 mg | ORAL_TABLET | Freq: Every day | ORAL | 1 refills | Status: DC

## 2016-04-11 MED ORDER — LEVOTHYROXINE SODIUM 125 MCG PO TABS: ORAL_TABLET | ORAL | 0 refills | Status: DC

## 2016-04-11 MED ORDER — SERTRALINE HCL 100 MG PO TABS: 100.0000 mg | ORAL_TABLET | Freq: Every day | ORAL | 0 refills | Status: DC

## 2016-04-11 MED ORDER — PROMETHAZINE HCL 25 MG PO TABS: 25.0000 mg | ORAL_TABLET | Freq: Three times a day (TID) | ORAL | 0 refills | Status: DC | PRN
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
## 2016-04-13 ENCOUNTER — Telehealth: Payer: Self-pay | Admitting: Internal Medicine

## 2016-04-13 LAB — CYTOLOGY - PAP: Diagnosis: NEGATIVE

## 2016-04-13 NOTE — Telephone Encounter (Signed)
-----   Message from Margaree MackintoshMary J Baxley, MD sent at 04/13/2016  2:16 PM EST ----- Pap is OK

## 2016-04-13 NOTE — Telephone Encounter (Signed)
Left message informing patient of normal pap results.

## 2016-04-29 ENCOUNTER — Other Ambulatory Visit: Payer: Self-pay | Admitting: Internal Medicine

## 2016-05-03 NOTE — Patient Instructions (Signed)
Please work on diet exercise and weight loss. Continue same medications and return in one year or as needed.

## 2016-05-06 ENCOUNTER — Other Ambulatory Visit: Payer: Self-pay | Admitting: Internal Medicine

## 2016-05-08 ENCOUNTER — Other Ambulatory Visit: Payer: Self-pay | Admitting: Internal Medicine

## 2016-05-12 ENCOUNTER — Encounter: Payer: Self-pay | Admitting: Internal Medicine

## 2016-05-16 ENCOUNTER — Other Ambulatory Visit: Payer: Self-pay | Admitting: Internal Medicine

## 2016-11-09 ENCOUNTER — Other Ambulatory Visit: Payer: Self-pay | Admitting: Internal Medicine

## 2017-03-01 ENCOUNTER — Other Ambulatory Visit: Payer: Self-pay | Admitting: Internal Medicine

## 2017-03-01 NOTE — Telephone Encounter (Signed)
No CPE booked due January 2019. Please contact before refilling

## 2017-03-02 NOTE — Telephone Encounter (Signed)
Left detailed message.   

## 2017-03-16 ENCOUNTER — Other Ambulatory Visit: Payer: Self-pay | Admitting: Internal Medicine

## 2017-03-29 ENCOUNTER — Encounter: Payer: Self-pay | Admitting: Internal Medicine

## 2017-04-12 ENCOUNTER — Other Ambulatory Visit: Payer: Self-pay | Admitting: Internal Medicine

## 2017-04-12 DIAGNOSIS — Z1329 Encounter for screening for other suspected endocrine disorder: Secondary | ICD-10-CM

## 2017-04-12 DIAGNOSIS — Z0000 Encounter for general adult medical examination without abnormal findings: Secondary | ICD-10-CM

## 2017-04-12 DIAGNOSIS — Z13220 Encounter for screening for lipoid disorders: Secondary | ICD-10-CM

## 2017-04-12 DIAGNOSIS — Z1321 Encounter for screening for nutritional disorder: Secondary | ICD-10-CM

## 2017-04-30 ENCOUNTER — Other Ambulatory Visit: Payer: Commercial Managed Care - PPO | Admitting: Internal Medicine

## 2017-04-30 DIAGNOSIS — Z13220 Encounter for screening for lipoid disorders: Secondary | ICD-10-CM

## 2017-04-30 DIAGNOSIS — Z1321 Encounter for screening for nutritional disorder: Secondary | ICD-10-CM | POA: Diagnosis not present

## 2017-04-30 DIAGNOSIS — Z0000 Encounter for general adult medical examination without abnormal findings: Secondary | ICD-10-CM | POA: Diagnosis not present

## 2017-04-30 DIAGNOSIS — Z1329 Encounter for screening for other suspected endocrine disorder: Secondary | ICD-10-CM

## 2017-05-01 LAB — CBC WITH DIFFERENTIAL/PLATELET
BASOS ABS: 49 {cells}/uL (ref 0–200)
BASOS PCT: 0.7 %
EOS ABS: 140 {cells}/uL (ref 15–500)
Eosinophils Relative: 2 %
HCT: 41 % (ref 35.0–45.0)
HEMOGLOBIN: 14.2 g/dL (ref 11.7–15.5)
Lymphs Abs: 2128 cells/uL (ref 850–3900)
MCH: 30.3 pg (ref 27.0–33.0)
MCHC: 34.6 g/dL (ref 32.0–36.0)
MCV: 87.6 fL (ref 80.0–100.0)
MPV: 10 fL (ref 7.5–12.5)
Monocytes Relative: 6.6 %
NEUTROS ABS: 4221 {cells}/uL (ref 1500–7800)
Neutrophils Relative %: 60.3 %
Platelets: 378 10*3/uL (ref 140–400)
RBC: 4.68 10*6/uL (ref 3.80–5.10)
RDW: 12.1 % (ref 11.0–15.0)
Total Lymphocyte: 30.4 %
WBC: 7 10*3/uL (ref 3.8–10.8)
WBCMIX: 462 {cells}/uL (ref 200–950)

## 2017-05-01 LAB — LIPID PANEL
CHOL/HDL RATIO: 3.6 (calc) (ref <5.0)
CHOLESTEROL: 216 mg/dL — AB (ref <200)
HDL: 60 mg/dL (ref >50)
LDL Cholesterol (Calc): 135 mg/dL (calc) — ABNORMAL HIGH
Non-HDL Cholesterol (Calc): 156 mg/dL (calc) — ABNORMAL HIGH (ref <130)
Triglycerides: 100 mg/dL (ref <150)

## 2017-05-01 LAB — TSH: TSH: 3.36 mIU/L

## 2017-05-01 LAB — HEMOGLOBIN A1C
Hgb A1c MFr Bld: 5.4 % of total Hgb (ref <5.7)
MEAN PLASMA GLUCOSE: 108 (calc)
eAG (mmol/L): 6 (calc)

## 2017-05-01 LAB — VITAMIN D 25 HYDROXY (VIT D DEFICIENCY, FRACTURES): Vit D, 25-Hydroxy: 32 ng/mL (ref 30–100)

## 2017-05-01 LAB — COMPLETE METABOLIC PANEL WITH GFR
AG RATIO: 1.4 (calc) (ref 1.0–2.5)
ALT: 26 U/L (ref 6–29)
AST: 18 U/L (ref 10–35)
Albumin: 4.1 g/dL (ref 3.6–5.1)
Alkaline phosphatase (APISO): 95 U/L (ref 33–115)
BUN: 15 mg/dL (ref 7–25)
CALCIUM: 9.4 mg/dL (ref 8.6–10.2)
CO2: 29 mmol/L (ref 20–32)
CREATININE: 0.72 mg/dL (ref 0.50–1.10)
Chloride: 104 mmol/L (ref 98–110)
GFR, EST AFRICAN AMERICAN: 115 mL/min/{1.73_m2} (ref >=60)
GFR, EST NON AFRICAN AMERICAN: 99 mL/min/{1.73_m2} (ref >=60)
GLOBULIN: 3 g/dL (ref 1.9–3.7)
Glucose, Bld: 89 mg/dL (ref 65–99)
POTASSIUM: 4.3 mmol/L (ref 3.5–5.3)
SODIUM: 140 mmol/L (ref 135–146)
TOTAL PROTEIN: 7.1 g/dL (ref 6.1–8.1)
Total Bilirubin: 0.6 mg/dL (ref 0.2–1.2)

## 2017-05-03 ENCOUNTER — Encounter: Payer: Self-pay | Admitting: Internal Medicine

## 2017-05-03 ENCOUNTER — Ambulatory Visit (INDEPENDENT_AMBULATORY_CARE_PROVIDER_SITE_OTHER): Payer: Commercial Managed Care - PPO | Admitting: Internal Medicine

## 2017-05-03 VITALS — BP 130/80 | HR 86 | Ht 66.25 in | Wt 265.0 lb

## 2017-05-03 DIAGNOSIS — Z8659 Personal history of other mental and behavioral disorders: Secondary | ICD-10-CM | POA: Diagnosis not present

## 2017-05-03 DIAGNOSIS — E7801 Familial hypercholesterolemia: Secondary | ICD-10-CM

## 2017-05-03 DIAGNOSIS — Z0000 Encounter for general adult medical examination without abnormal findings: Secondary | ICD-10-CM

## 2017-05-03 DIAGNOSIS — Z23 Encounter for immunization: Secondary | ICD-10-CM | POA: Diagnosis not present

## 2017-05-03 DIAGNOSIS — E039 Hypothyroidism, unspecified: Secondary | ICD-10-CM

## 2017-05-03 DIAGNOSIS — Z6841 Body Mass Index (BMI) 40.0 and over, adult: Secondary | ICD-10-CM

## 2017-05-03 LAB — POCT URINALYSIS DIPSTICK
Appearance: NORMAL
Bilirubin, UA: NEGATIVE
Glucose, UA: NEGATIVE
KETONES UA: NEGATIVE
LEUKOCYTES UA: NEGATIVE
NITRITE UA: NEGATIVE
Odor: NORMAL
PH UA: 6.5 (ref 5.0–8.0)
PROTEIN UA: NEGATIVE
RBC UA: NEGATIVE
SPEC GRAV UA: 1.015 (ref 1.010–1.025)
UROBILINOGEN UA: 0.2 U/dL

## 2017-05-03 MED ORDER — PROMETHAZINE HCL 25 MG PO TABS: 25.0000 mg | ORAL_TABLET | Freq: Three times a day (TID) | ORAL | 1 refills | Status: DC | PRN

## 2017-05-03 MED ORDER — LEVOTHYROXINE SODIUM 125 MCG PO TABS: ORAL_TABLET | ORAL | 1 refills | Status: DC
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
## 2017-05-11 ENCOUNTER — Other Ambulatory Visit: Payer: Self-pay | Admitting: Internal Medicine

## 2017-05-11 DIAGNOSIS — Z139 Encounter for screening, unspecified: Secondary | ICD-10-CM

## 2017-05-26 NOTE — Patient Instructions (Signed)
Consider seeing Dr. Dalbert GarnetBeasley for weight management.  Continue same medications.  Follow-up in 1 year.  Please try to get some exercise.  Cut out high calorie foods.

## 2017-05-29 ENCOUNTER — Other Ambulatory Visit: Payer: Self-pay | Admitting: Internal Medicine

## 2017-06-18 ENCOUNTER — Ambulatory Visit
Admission: RE | Admit: 2017-06-18 | Discharge: 2017-06-18 | Disposition: A | Discharge status: Home / Self Care | Payer: Commercial Managed Care - PPO | Source: Ambulatory Visit | Attending: Internal Medicine | Admitting: Internal Medicine

## 2017-06-18 DIAGNOSIS — Z1231 Encounter for screening mammogram for malignant neoplasm of breast: Secondary | ICD-10-CM | POA: Diagnosis not present

## 2017-06-18 DIAGNOSIS — Z139 Encounter for screening, unspecified: Secondary | ICD-10-CM

## 2017-07-11 ENCOUNTER — Other Ambulatory Visit: Payer: Self-pay

## 2017-07-11 DIAGNOSIS — E785 Hyperlipidemia, unspecified: Secondary | ICD-10-CM

## 2017-07-23 ENCOUNTER — Other Ambulatory Visit: Payer: Self-pay | Admitting: Internal Medicine

## 2017-07-31 ENCOUNTER — Other Ambulatory Visit: Payer: Commercial Managed Care - PPO | Admitting: Internal Medicine

## 2017-08-02 ENCOUNTER — Ambulatory Visit: Payer: Commercial Managed Care - PPO | Admitting: Internal Medicine

## 2017-08-07 ENCOUNTER — Telehealth: Payer: Self-pay | Admitting: Internal Medicine

## 2017-08-07 ENCOUNTER — Other Ambulatory Visit: Payer: Commercial Managed Care - PPO | Admitting: Internal Medicine

## 2017-08-07 DIAGNOSIS — E785 Hyperlipidemia, unspecified: Secondary | ICD-10-CM | POA: Diagnosis not present

## 2017-08-07 LAB — LIPID PANEL
Cholesterol: 215 mg/dL — ABNORMAL HIGH (ref <200)
HDL: 55 mg/dL (ref >50)
LDL Cholesterol (Calc): 141 mg/dL (calc) — ABNORMAL HIGH
NON-HDL CHOLESTEROL (CALC): 160 mg/dL — AB (ref <130)
Total CHOL/HDL Ratio: 3.9 (calc) (ref <5.0)
Triglycerides: 84 mg/dL (ref <150)

## 2017-08-07 NOTE — Telephone Encounter (Signed)
Brandy Price Self [phone]  [name] dropped off a Physician Health Screening Form to be filled out.

## 2017-08-08 NOTE — Telephone Encounter (Signed)
Left message for patient physical form is almost completed, I will need to measure her waist circumference whenever she comes to pick up the form and add it to the form.

## 2017-08-10 ENCOUNTER — Ambulatory Visit: Payer: Commercial Managed Care - PPO | Admitting: Internal Medicine

## 2017-08-17 ENCOUNTER — Encounter: Payer: Self-pay | Admitting: Internal Medicine

## 2017-08-17 ENCOUNTER — Ambulatory Visit (INDEPENDENT_AMBULATORY_CARE_PROVIDER_SITE_OTHER): Payer: Commercial Managed Care - PPO | Admitting: Internal Medicine

## 2017-08-17 VITALS — BP 120/88 | HR 82 | Ht 66.25 in | Wt 267.0 lb

## 2017-08-17 DIAGNOSIS — E039 Hypothyroidism, unspecified: Secondary | ICD-10-CM | POA: Diagnosis not present

## 2017-08-17 DIAGNOSIS — Z6841 Body Mass Index (BMI) 40.0 and over, adult: Secondary | ICD-10-CM

## 2017-08-17 DIAGNOSIS — E785 Hyperlipidemia, unspecified: Secondary | ICD-10-CM | POA: Diagnosis not present

## 2017-08-17 MED ORDER — SIMVASTATIN 40 MG PO TABS: 40.0000 mg | ORAL_TABLET | Freq: Every day | ORAL | 3 refills | Status: DC

## 2017-08-17 NOTE — Progress Notes (Signed)
   Subjective:    Patient ID: Brandy Price, female    DOB: 05/22/1970, 47 y.o.   MRN: [id number]  HPI She was here for routine health maintenance exam in January.  At that time her total cholesterol was 216 and LDL cholesterol 135.  In December 2017 total cholesterol was 195 and LDL cholesterol was 118 with normal triglycerides.  Patient was advised to get serious about diet and exercise.  She has been on Zocor 20 mg daily does not appear to be helping her cholesterol.  She admitted she was not following a strict low-fat diet nor getting a lot of exercise.  Her BMI is 42.77.  Her weight is 267 pounds which is concerning.  She has not really been motivated to diet and exercise.  We talked today about seeing obesity doctor, Dr. Dalbert Garnet.  She wants to try to lose weight on her own.  Says she will get more active this summer.  I am increasing her Zocor to 40 mg daily.  We had a long talk about her health.  She does have hypothyroidism and is compliant with her thyroid medication    Review of Systems     Objective:   Physical Exam Not examined but spent 20 minutes speaking with her about these issues of obesity and hyperlipidemia.  Hyperlipidemia-increase       Assessment & Plan:  Zocor to 40 mg daily and follow-up in August  BMI 42.77-please work on diet exercise and weight loss and follow-up in August

## 2017-08-29 ENCOUNTER — Encounter: Payer: Self-pay | Admitting: Internal Medicine

## 2017-08-29 NOTE — Patient Instructions (Signed)
Please work on diet exercise and weight loss and follow-up in August.  Increase Zocor to 40 mg daily.  Continue same dose of thyroid replacement

## 2017-10-03 ENCOUNTER — Other Ambulatory Visit: Payer: Self-pay | Admitting: Internal Medicine

## 2017-11-16 ENCOUNTER — Other Ambulatory Visit: Payer: Self-pay | Admitting: Internal Medicine

## 2017-11-16 DIAGNOSIS — Z79899 Other long term (current) drug therapy: Secondary | ICD-10-CM

## 2017-11-16 DIAGNOSIS — E785 Hyperlipidemia, unspecified: Secondary | ICD-10-CM

## 2017-11-16 DIAGNOSIS — Z5181 Encounter for therapeutic drug level monitoring: Secondary | ICD-10-CM

## 2017-11-20 ENCOUNTER — Other Ambulatory Visit: Payer: Commercial Managed Care - PPO | Admitting: Internal Medicine

## 2017-11-20 DIAGNOSIS — Z79899 Other long term (current) drug therapy: Secondary | ICD-10-CM

## 2017-11-20 DIAGNOSIS — E785 Hyperlipidemia, unspecified: Secondary | ICD-10-CM

## 2017-11-20 DIAGNOSIS — Z5181 Encounter for therapeutic drug level monitoring: Secondary | ICD-10-CM

## 2017-11-20 LAB — HEPATIC FUNCTION PANEL
AG Ratio: 1.4 (calc) (ref 1.0–2.5)
ALBUMIN MSPROF: 4.2 g/dL (ref 3.6–5.1)
ALT: 31 U/L — AB (ref 6–29)
AST: 22 U/L (ref 10–35)
Alkaline phosphatase (APISO): 94 U/L (ref 33–115)
BILIRUBIN TOTAL: 0.6 mg/dL (ref 0.2–1.2)
Bilirubin, Direct: 0.1 mg/dL (ref 0.0–0.2)
Globulin: 3 g/dL (calc) (ref 1.9–3.7)
Indirect Bilirubin: 0.5 mg/dL (calc) (ref 0.2–1.2)
TOTAL PROTEIN: 7.2 g/dL (ref 6.1–8.1)

## 2017-11-20 LAB — LIPID PANEL
CHOL/HDL RATIO: 3.6 (calc) (ref <5.0)
CHOLESTEROL: 193 mg/dL (ref <200)
HDL: 54 mg/dL (ref >50)
LDL CHOLESTEROL (CALC): 118 mg/dL — AB
Non-HDL Cholesterol (Calc): 139 mg/dL (calc) — ABNORMAL HIGH (ref <130)
Triglycerides: 100 mg/dL (ref <150)

## 2017-11-23 ENCOUNTER — Encounter: Payer: Self-pay | Admitting: Internal Medicine

## 2017-11-23 ENCOUNTER — Ambulatory Visit (INDEPENDENT_AMBULATORY_CARE_PROVIDER_SITE_OTHER): Payer: Commercial Managed Care - PPO | Admitting: Internal Medicine

## 2017-11-23 VITALS — BP 128/70 | HR 86 | Ht 66.25 in | Wt 265.0 lb

## 2017-11-23 DIAGNOSIS — E039 Hypothyroidism, unspecified: Secondary | ICD-10-CM

## 2017-11-23 DIAGNOSIS — E7800 Pure hypercholesterolemia, unspecified: Secondary | ICD-10-CM | POA: Diagnosis not present

## 2017-11-23 MED ORDER — SIMVASTATIN 80 MG PO TABS: 80.0000 mg | ORAL_TABLET | Freq: Every day | ORAL | 1 refills | Status: DC

## 2017-11-23 NOTE — Progress Notes (Signed)
   Subjective:    Patient ID: Brandy MinksElizabeth H Kemler, female    DOB: 1971-01-23, 47 y.o.   MRN: [id number]  HPI 47 year old Female in today for follow-up on hyperlipidemia and obesity. She really is not motivated to diet and exercise.  Have suggested Dr. Francena HanlyBeasley's clinic previously but she has not followed through with that.  In May she was here for follow-up of hyperlipidemia.  Zocor was increased to 40 mg daily.  Her weight at that time was 267 pounds.  Today she weighs 265 pounds and BMI is 42.45.  Her TSH was checked in January and was in the 3 range.  Admits to snacking.  Not walking as much as she had hoped would.  In January she had a normal hemoglobin A1c of 5.4%.  Her LDL was 141 in May and 135 in January   Review of Systems no new complaints     Objective:   Physical Exam  Not examined but spent 15 minutes talking with her about these medical issues including diet exercise weight loss and watching calories and fat in the diet.      Assessment & Plan:  Hyperlipidemia  Morbid obesity  Hypothyroidism  Plan: Increase Zocor to 80 mg daily.  Her physical exam is due in January.  However she will return in November for follow-up with lipid panel and liver functions only without office visit.  TSH should be checked at that time.

## 2017-11-29 NOTE — Patient Instructions (Addendum)
Increase Zocor to 80 mg daily and return in November for follow-up with lipid panel and liver functions without office visit.  TSH should be checked at that time.  Her physical exam is due January 2020.  Suggest Dr. Francena HanlyBeasley's clinic.

## 2017-12-23 ENCOUNTER — Other Ambulatory Visit: Payer: Self-pay | Admitting: Internal Medicine

## 2018-01-16 ENCOUNTER — Ambulatory Visit: Payer: Commercial Managed Care - PPO | Admitting: Internal Medicine

## 2018-02-21 ENCOUNTER — Other Ambulatory Visit: Payer: Self-pay | Admitting: Internal Medicine

## 2018-02-21 DIAGNOSIS — Z79899 Other long term (current) drug therapy: Principal | ICD-10-CM

## 2018-02-21 DIAGNOSIS — E7800 Pure hypercholesterolemia, unspecified: Secondary | ICD-10-CM

## 2018-02-21 DIAGNOSIS — Z5181 Encounter for therapeutic drug level monitoring: Secondary | ICD-10-CM

## 2018-02-25 ENCOUNTER — Other Ambulatory Visit: Payer: Commercial Managed Care - PPO | Admitting: Internal Medicine

## 2018-02-25 DIAGNOSIS — Z5181 Encounter for therapeutic drug level monitoring: Secondary | ICD-10-CM | POA: Diagnosis not present

## 2018-02-25 DIAGNOSIS — E039 Hypothyroidism, unspecified: Secondary | ICD-10-CM | POA: Diagnosis not present

## 2018-02-25 DIAGNOSIS — E7800 Pure hypercholesterolemia, unspecified: Secondary | ICD-10-CM

## 2018-02-25 DIAGNOSIS — Z79899 Other long term (current) drug therapy: Principal | ICD-10-CM

## 2018-02-27 ENCOUNTER — Other Ambulatory Visit: Payer: Self-pay | Admitting: Internal Medicine

## 2018-02-27 DIAGNOSIS — E039 Hypothyroidism, unspecified: Secondary | ICD-10-CM

## 2018-02-27 LAB — HEPATIC FUNCTION PANEL
AG RATIO: 1.6 (calc) (ref 1.0–2.5)
ALKALINE PHOSPHATASE (APISO): 89 U/L (ref 33–115)
ALT: 20 U/L (ref 6–29)
AST: 15 U/L (ref 10–35)
Albumin: 4.3 g/dL (ref 3.6–5.1)
BILIRUBIN DIRECT: 0.1 mg/dL (ref 0.0–0.2)
BILIRUBIN TOTAL: 0.5 mg/dL (ref 0.2–1.2)
Globulin: 2.7 g/dL (calc) (ref 1.9–3.7)
Indirect Bilirubin: 0.4 mg/dL (calc) (ref 0.2–1.2)
Total Protein: 7 g/dL (ref 6.1–8.1)

## 2018-02-27 LAB — LIPID PANEL
Cholesterol: 184 mg/dL (ref <200)
HDL: 59 mg/dL (ref >50)
LDL Cholesterol (Calc): 109 mg/dL (calc) — ABNORMAL HIGH
NON-HDL CHOLESTEROL (CALC): 125 mg/dL (ref <130)
Total CHOL/HDL Ratio: 3.1 (calc) (ref <5.0)
Triglycerides: 75 mg/dL (ref <150)

## 2018-02-27 LAB — TEST AUTHORIZATION

## 2018-02-27 LAB — TSH: TSH: 3.18 mIU/L

## 2018-03-18 ENCOUNTER — Other Ambulatory Visit: Payer: Self-pay | Admitting: Internal Medicine

## 2018-05-13 ENCOUNTER — Other Ambulatory Visit: Payer: Self-pay | Admitting: Internal Medicine

## 2018-06-17 ENCOUNTER — Other Ambulatory Visit: Payer: Commercial Managed Care - PPO | Admitting: Internal Medicine

## 2018-06-21 ENCOUNTER — Encounter: Payer: Commercial Managed Care - PPO | Admitting: Internal Medicine

## 2018-06-21 ENCOUNTER — Other Ambulatory Visit: Payer: Self-pay | Admitting: Internal Medicine

## 2018-07-11 ENCOUNTER — Telehealth: Payer: Self-pay | Admitting: Internal Medicine

## 2018-07-11 NOTE — Telephone Encounter (Signed)
Unable to LVM to CB to reschedule appt. Mail box is full and not accepting messages at this time.

## 2018-07-23 ENCOUNTER — Encounter: Payer: Self-pay | Admitting: Internal Medicine

## 2018-07-23 NOTE — Telephone Encounter (Signed)
Mailed letter to call office and schedule Physical appointment.

## 2018-08-02 NOTE — Telephone Encounter (Signed)
Patient sent message thru My Chart so we got appointment rescheduled.

## 2018-09-10 ENCOUNTER — Other Ambulatory Visit: Payer: Self-pay | Admitting: Internal Medicine

## 2018-09-10 DIAGNOSIS — Z1231 Encounter for screening mammogram for malignant neoplasm of breast: Secondary | ICD-10-CM

## 2018-09-13 ENCOUNTER — Other Ambulatory Visit: Payer: Self-pay | Admitting: Internal Medicine

## 2018-10-14 ENCOUNTER — Other Ambulatory Visit: Payer: Commercial Managed Care - PPO | Admitting: Internal Medicine

## 2018-10-17 ENCOUNTER — Encounter: Payer: Commercial Managed Care - PPO | Admitting: Internal Medicine

## 2018-10-24 ENCOUNTER — Other Ambulatory Visit: Payer: Commercial Managed Care - PPO | Admitting: Internal Medicine

## 2018-10-24 DIAGNOSIS — E7800 Pure hypercholesterolemia, unspecified: Secondary | ICD-10-CM

## 2018-10-24 DIAGNOSIS — E039 Hypothyroidism, unspecified: Secondary | ICD-10-CM

## 2018-10-24 DIAGNOSIS — Z0000 Encounter for general adult medical examination without abnormal findings: Secondary | ICD-10-CM

## 2018-10-25 LAB — CBC WITH DIFFERENTIAL/PLATELET
Absolute Monocytes: 370 cells/uL (ref 200–950)
Basophils Absolute: 40 cells/uL (ref 0–200)
Basophils Relative: 0.8 %
Eosinophils Absolute: 160 cells/uL (ref 15–500)
Eosinophils Relative: 3.2 %
HCT: 40.8 % (ref 35.0–45.0)
Hemoglobin: 14.2 g/dL (ref 11.7–15.5)
Lymphs Abs: 1490 cells/uL (ref 850–3900)
MCH: 31 pg (ref 27.0–33.0)
MCHC: 34.8 g/dL (ref 32.0–36.0)
MCV: 89.1 fL (ref 80.0–100.0)
MPV: 10.2 fL (ref 7.5–12.5)
Monocytes Relative: 7.4 %
Neutro Abs: 2940 cells/uL (ref 1500–7800)
Neutrophils Relative %: 58.8 %
Platelets: 365 10*3/uL (ref 140–400)
RBC: 4.58 10*6/uL (ref 3.80–5.10)
RDW: 12 % (ref 11.0–15.0)
Total Lymphocyte: 29.8 %
WBC: 5 10*3/uL (ref 3.8–10.8)

## 2018-10-25 LAB — COMPLETE METABOLIC PANEL WITH GFR
AG Ratio: 1.4 (calc) (ref 1.0–2.5)
ALT: 22 U/L (ref 6–29)
AST: 17 U/L (ref 10–35)
Albumin: 3.9 g/dL (ref 3.6–5.1)
Alkaline phosphatase (APISO): 83 U/L (ref 37–153)
BUN: 15 mg/dL (ref 7–25)
CO2: 25 mmol/L (ref 20–32)
Calcium: 9.2 mg/dL (ref 8.6–10.4)
Chloride: 106 mmol/L (ref 98–110)
Creat: 0.58 mg/dL (ref 0.50–1.05)
GFR, Est African American: 125 mL/min/{1.73_m2} (ref >=60)
GFR, Est Non African American: 107 mL/min/{1.73_m2} (ref >=60)
Globulin: 2.8 g/dL (calc) (ref 1.9–3.7)
Glucose, Bld: 102 mg/dL — ABNORMAL HIGH (ref 65–99)
Potassium: 4.5 mmol/L (ref 3.5–5.3)
Sodium: 140 mmol/L (ref 135–146)
Total Bilirubin: 0.7 mg/dL (ref 0.2–1.2)
Total Protein: 6.7 g/dL (ref 6.1–8.1)

## 2018-10-25 LAB — HEMOGLOBIN A1C
Hgb A1c MFr Bld: 5.3 % of total Hgb (ref <5.7)
Mean Plasma Glucose: 105 (calc)
eAG (mmol/L): 5.8 (calc)

## 2018-10-25 LAB — LIPID PANEL
Cholesterol: 175 mg/dL (ref <200)
HDL: 50 mg/dL (ref >=50)
LDL Cholesterol (Calc): 107 mg/dL (calc) — ABNORMAL HIGH
Non-HDL Cholesterol (Calc): 125 mg/dL (calc) (ref <130)
Total CHOL/HDL Ratio: 3.5 (calc) (ref <5.0)
Triglycerides: 88 mg/dL (ref <150)

## 2018-10-25 LAB — VITAMIN D 25 HYDROXY (VIT D DEFICIENCY, FRACTURES): Vit D, 25-Hydroxy: 38 ng/mL (ref 30–100)

## 2018-10-25 LAB — TSH: TSH: 2.34 mIU/L

## 2018-10-28 ENCOUNTER — Other Ambulatory Visit: Payer: Commercial Managed Care - PPO | Admitting: Internal Medicine

## 2018-10-31 ENCOUNTER — Ambulatory Visit (INDEPENDENT_AMBULATORY_CARE_PROVIDER_SITE_OTHER): Payer: Commercial Managed Care - PPO | Admitting: Internal Medicine

## 2018-10-31 ENCOUNTER — Ambulatory Visit
Admission: RE | Admit: 2018-10-31 | Discharge: 2018-10-31 | Disposition: A | Discharge status: Home / Self Care | Payer: Commercial Managed Care - PPO | Source: Ambulatory Visit | Attending: Internal Medicine | Admitting: Internal Medicine

## 2018-10-31 ENCOUNTER — Other Ambulatory Visit: Payer: Self-pay

## 2018-10-31 ENCOUNTER — Encounter: Payer: Self-pay | Admitting: Internal Medicine

## 2018-10-31 ENCOUNTER — Other Ambulatory Visit: Payer: Self-pay | Admitting: Internal Medicine

## 2018-10-31 VITALS — BP 120/80 | HR 84 | Temp 98.4°F | Ht 66.0 in | Wt 274.0 lb

## 2018-10-31 DIAGNOSIS — Z0000 Encounter for general adult medical examination without abnormal findings: Secondary | ICD-10-CM | POA: Diagnosis not present

## 2018-10-31 DIAGNOSIS — E039 Hypothyroidism, unspecified: Secondary | ICD-10-CM | POA: Diagnosis not present

## 2018-10-31 DIAGNOSIS — Z8659 Personal history of other mental and behavioral disorders: Secondary | ICD-10-CM | POA: Diagnosis not present

## 2018-10-31 DIAGNOSIS — E7800 Pure hypercholesterolemia, unspecified: Secondary | ICD-10-CM | POA: Diagnosis not present

## 2018-10-31 DIAGNOSIS — Z1231 Encounter for screening mammogram for malignant neoplasm of breast: Secondary | ICD-10-CM

## 2018-10-31 LAB — POCT URINALYSIS DIPSTICK
Appearance: NEGATIVE
Bilirubin, UA: NEGATIVE
Blood, UA: NEGATIVE
Glucose, UA: NEGATIVE
Ketones, UA: NEGATIVE
Leukocytes, UA: NEGATIVE
Nitrite, UA: NEGATIVE
Odor: NEGATIVE
Protein, UA: NEGATIVE
Spec Grav, UA: 1.01 (ref 1.010–1.025)
Urobilinogen, UA: 0.2 E.U./dL
pH, UA: 6 (ref 5.0–8.0)
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
## 2018-11-08 ENCOUNTER — Other Ambulatory Visit: Payer: Self-pay | Admitting: Internal Medicine

## 2018-11-24 ENCOUNTER — Encounter: Payer: Self-pay | Admitting: Internal Medicine

## 2018-11-24 NOTE — Patient Instructions (Signed)
It was a pleasure to see you today.  I am concerned about your weight and would like for you to to work on diet exercise and weight loss.  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  Return in 1 year or as needed.  Have flu vaccine through employment.

## 2018-12-30 ENCOUNTER — Other Ambulatory Visit: Payer: Self-pay | Admitting: Internal Medicine

## 2019-03-21 ENCOUNTER — Other Ambulatory Visit: Payer: Self-pay | Admitting: Internal Medicine

## 2019-05-02 ENCOUNTER — Other Ambulatory Visit: Payer: Self-pay | Admitting: Internal Medicine

## 2019-06-10 ENCOUNTER — Other Ambulatory Visit: Payer: Self-pay | Admitting: Internal Medicine

## 2019-06-11 NOTE — Telephone Encounter (Signed)
Needs CPE to be booked for July before refilling

## 2019-08-03 ENCOUNTER — Other Ambulatory Visit: Payer: Self-pay | Admitting: Internal Medicine

## 2019-10-18 ENCOUNTER — Other Ambulatory Visit: Payer: Self-pay | Admitting: Internal Medicine

## 2019-10-23 ENCOUNTER — Other Ambulatory Visit: Payer: Self-pay | Admitting: Internal Medicine

## 2019-11-06 ENCOUNTER — Other Ambulatory Visit: Payer: Self-pay | Admitting: Internal Medicine

## 2019-11-06 DIAGNOSIS — Z1231 Encounter for screening mammogram for malignant neoplasm of breast: Secondary | ICD-10-CM

## 2019-11-11 ENCOUNTER — Other Ambulatory Visit: Payer: Commercial Managed Care - PPO | Admitting: Internal Medicine

## 2019-11-11 ENCOUNTER — Ambulatory Visit
Admission: RE | Admit: 2019-11-11 | Discharge: 2019-11-11 | Disposition: A | Discharge status: Home / Self Care | Payer: Commercial Managed Care - PPO | Source: Ambulatory Visit

## 2019-11-11 ENCOUNTER — Other Ambulatory Visit: Payer: Self-pay

## 2019-11-11 DIAGNOSIS — Z1231 Encounter for screening mammogram for malignant neoplasm of breast: Secondary | ICD-10-CM

## 2019-11-11 DIAGNOSIS — E039 Hypothyroidism, unspecified: Secondary | ICD-10-CM

## 2019-11-11 DIAGNOSIS — E7800 Pure hypercholesterolemia, unspecified: Secondary | ICD-10-CM

## 2019-11-11 DIAGNOSIS — Z0000 Encounter for general adult medical examination without abnormal findings: Secondary | ICD-10-CM

## 2019-11-11 DIAGNOSIS — Z1321 Encounter for screening for nutritional disorder: Secondary | ICD-10-CM

## 2019-11-14 ENCOUNTER — Other Ambulatory Visit (HOSPITAL_COMMUNITY)
Admission: RE | Admit: 2019-11-14 | Discharge: 2019-11-14 | Disposition: A | Discharge status: Home / Self Care | Payer: Commercial Managed Care - PPO | Source: Ambulatory Visit | Attending: Internal Medicine | Admitting: Internal Medicine

## 2019-11-14 ENCOUNTER — Ambulatory Visit (INDEPENDENT_AMBULATORY_CARE_PROVIDER_SITE_OTHER): Payer: Commercial Managed Care - PPO | Admitting: Internal Medicine

## 2019-11-14 ENCOUNTER — Encounter: Payer: Self-pay | Admitting: Internal Medicine

## 2019-11-14 ENCOUNTER — Other Ambulatory Visit: Payer: Self-pay

## 2019-11-14 VITALS — BP 120/88 | HR 86 | Ht 66.0 in | Wt 272.0 lb

## 2019-11-14 DIAGNOSIS — Z124 Encounter for screening for malignant neoplasm of cervix: Secondary | ICD-10-CM | POA: Diagnosis not present

## 2019-11-14 DIAGNOSIS — Z6841 Body Mass Index (BMI) 40.0 and over, adult: Secondary | ICD-10-CM

## 2019-11-14 DIAGNOSIS — E039 Hypothyroidism, unspecified: Secondary | ICD-10-CM | POA: Diagnosis not present

## 2019-11-14 DIAGNOSIS — E7800 Pure hypercholesterolemia, unspecified: Secondary | ICD-10-CM | POA: Diagnosis not present

## 2019-11-14 DIAGNOSIS — Z0000 Encounter for general adult medical examination without abnormal findings: Secondary | ICD-10-CM

## 2019-11-14 DIAGNOSIS — Z8659 Personal history of other mental and behavioral disorders: Secondary | ICD-10-CM | POA: Diagnosis not present

## 2019-11-14 DIAGNOSIS — R739 Hyperglycemia, unspecified: Secondary | ICD-10-CM

## 2019-11-14 LAB — POCT URINALYSIS DIPSTICK
Appearance: NEGATIVE
Bilirubin, UA: NEGATIVE
Blood, UA: NEGATIVE
Glucose, UA: NEGATIVE
Ketones, UA: NEGATIVE
Leukocytes, UA: NEGATIVE
Nitrite, UA: NEGATIVE
Odor: NEGATIVE
Protein, UA: NEGATIVE
Spec Grav, UA: 1.025 (ref 1.010–1.025)
Urobilinogen, UA: 0.2 E.U./dL
pH, UA: 6 (ref 5.0–8.0)
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
## 2019-11-15 LAB — CBC WITH DIFFERENTIAL/PLATELET
Absolute Monocytes: 450 cells/uL (ref 200–950)
Basophils Absolute: 60 cells/uL (ref 0–200)
Basophils Relative: 1 %
Eosinophils Absolute: 120 cells/uL (ref 15–500)
Eosinophils Relative: 2 %
HCT: 43.1 % (ref 35.0–45.0)
Hemoglobin: 14.4 g/dL (ref 11.7–15.5)
Lymphs Abs: 1578 cells/uL (ref 850–3900)
MCH: 30.4 pg (ref 27.0–33.0)
MCHC: 33.4 g/dL (ref 32.0–36.0)
MCV: 90.9 fL (ref 80.0–100.0)
MPV: 10.6 fL (ref 7.5–12.5)
Monocytes Relative: 7.5 %
Neutro Abs: 3792 cells/uL (ref 1500–7800)
Neutrophils Relative %: 63.2 %
Platelets: 369 10*3/uL (ref 140–400)
RBC: 4.74 10*6/uL (ref 3.80–5.10)
RDW: 11.9 % (ref 11.0–15.0)
Total Lymphocyte: 26.3 %
WBC: 6 10*3/uL (ref 3.8–10.8)

## 2019-11-15 LAB — LIPID PANEL
Cholesterol: 188 mg/dL (ref <200)
HDL: 57 mg/dL (ref >=50)
LDL Cholesterol (Calc): 111 mg/dL (calc) — ABNORMAL HIGH
Non-HDL Cholesterol (Calc): 131 mg/dL (calc) — ABNORMAL HIGH (ref <130)
Total CHOL/HDL Ratio: 3.3 (calc) (ref <5.0)
Triglycerides: 96 mg/dL (ref <150)

## 2019-11-15 LAB — COMPLETE METABOLIC PANEL WITH GFR
AG Ratio: 1.3 (calc) (ref 1.0–2.5)
ALT: 20 U/L (ref 6–29)
AST: 16 U/L (ref 10–35)
Albumin: 3.9 g/dL (ref 3.6–5.1)
Alkaline phosphatase (APISO): 87 U/L (ref 37–153)
BUN: 13 mg/dL (ref 7–25)
CO2: 27 mmol/L (ref 20–32)
Calcium: 9.2 mg/dL (ref 8.6–10.4)
Chloride: 105 mmol/L (ref 98–110)
Creat: 0.71 mg/dL (ref 0.50–1.05)
GFR, Est African American: 114 mL/min/{1.73_m2} (ref >=60)
GFR, Est Non African American: 99 mL/min/{1.73_m2} (ref >=60)
Globulin: 3 g/dL (calc) (ref 1.9–3.7)
Glucose, Bld: 105 mg/dL — ABNORMAL HIGH (ref 65–99)
Potassium: 4.8 mmol/L (ref 3.5–5.3)
Sodium: 140 mmol/L (ref 135–146)
Total Bilirubin: 0.5 mg/dL (ref 0.2–1.2)
Total Protein: 6.9 g/dL (ref 6.1–8.1)

## 2019-11-15 LAB — TSH: TSH: 3.39 mIU/L

## 2019-11-15 LAB — HEMOGLOBIN A1C
Hgb A1c MFr Bld: 5.3 % of total Hgb (ref <5.7)
Mean Plasma Glucose: 105 (calc)
eAG (mmol/L): 5.8 (calc)

## 2019-11-15 LAB — TEST AUTHORIZATION

## 2019-11-15 LAB — VITAMIN D 25 HYDROXY (VIT D DEFICIENCY, FRACTURES): Vit D, 25-Hydroxy: 30 ng/mL (ref 30–100)

## 2019-11-18 ENCOUNTER — Other Ambulatory Visit: Payer: Self-pay | Admitting: Internal Medicine

## 2019-11-18 LAB — CYTOLOGY - PAP
Adequacy: ABSENT
Comment: NEGATIVE
Diagnosis: NEGATIVE
High risk HPV: NEGATIVE

## 2019-11-30 NOTE — Patient Instructions (Signed)
It was a pleasure to see you today.  Continue current medications and follow-up in 1 year.  Health screening form completed.  Please have colonoscopy.

## 2020-01-13 ENCOUNTER — Other Ambulatory Visit: Payer: Self-pay | Admitting: Internal Medicine

## 2020-04-01 ENCOUNTER — Other Ambulatory Visit: Payer: Self-pay | Admitting: Internal Medicine

## 2020-05-06 ENCOUNTER — Other Ambulatory Visit: Payer: Self-pay | Admitting: Internal Medicine

## 2020-10-29 ENCOUNTER — Telehealth: Payer: Commercial Managed Care - PPO | Admitting: Internal Medicine

## 2020-10-29 NOTE — Telephone Encounter (Signed)
LVM to CB if interested in rescheduling CPE for August.  Also sent MyChart message about opening of CPE in August, to call me to reschedule from October to August

## 2020-11-01 NOTE — Telephone Encounter (Signed)
scheduled

## 2020-11-12 ENCOUNTER — Other Ambulatory Visit: Payer: Commercial Managed Care - PPO | Admitting: Internal Medicine

## 2020-11-15 ENCOUNTER — Encounter: Payer: Commercial Managed Care - PPO | Admitting: Internal Medicine

## 2020-12-16 ENCOUNTER — Other Ambulatory Visit: Payer: Self-pay | Admitting: Internal Medicine

## 2021-01-14 ENCOUNTER — Other Ambulatory Visit: Payer: Self-pay | Admitting: Internal Medicine

## 2021-01-14 ENCOUNTER — Other Ambulatory Visit (HOSPITAL_BASED_OUTPATIENT_CLINIC_OR_DEPARTMENT_OTHER): Payer: Self-pay

## 2021-01-14 MED ORDER — LEVOTHYROXINE SODIUM 125 MCG PO TABS
125.0000 ug | ORAL_TABLET | Freq: Every day | ORAL | 0 refills | Status: DC
  Filled 2021-01-14: qty 30, 30d supply, fill #0
  Filled 2021-03-15: qty 30, 30d supply, fill #1
  Filled 2021-04-17: qty 30, 30d supply, fill #2

## 2021-01-14 MED ORDER — SERTRALINE HCL 100 MG PO TABS
100.0000 mg | ORAL_TABLET | Freq: Every day | ORAL | 0 refills | Status: DC
  Filled 2021-01-14: qty 30, 30d supply, fill #0
  Filled 2021-03-15: qty 30, 30d supply, fill #1
  Filled 2021-04-17: qty 30, 30d supply, fill #2

## 2021-01-14 NOTE — Telephone Encounter (Signed)
Brandy Price [phone]  New Pharmacy  Beth called in to say she is changing pharmacy, she would like below medications called in to new pharmacy below.  sertraline (ZOLOFT) 100 MG tablet  levothyroxine (SYNTHROID) 125 MCG tablet  [facility]

## 2021-01-17 ENCOUNTER — Other Ambulatory Visit (HOSPITAL_BASED_OUTPATIENT_CLINIC_OR_DEPARTMENT_OTHER): Payer: Self-pay

## 2021-01-17 ENCOUNTER — Other Ambulatory Visit: Payer: Commercial Managed Care - PPO | Admitting: Internal Medicine

## 2021-01-18 ENCOUNTER — Encounter: Payer: Commercial Managed Care - PPO | Admitting: Internal Medicine

## 2021-01-18 ENCOUNTER — Other Ambulatory Visit (HOSPITAL_BASED_OUTPATIENT_CLINIC_OR_DEPARTMENT_OTHER): Payer: Self-pay

## 2021-01-18 MED ORDER — SIMVASTATIN 80 MG PO TABS
80.0000 mg | ORAL_TABLET | Freq: Every day | ORAL | 2 refills | Status: DC
  Filled 2021-01-18: qty 30, 30d supply, fill #0
  Filled 2021-02-17: qty 30, 30d supply, fill #1
  Filled 2021-03-21: qty 30, 30d supply, fill #2

## 2021-01-19 ENCOUNTER — Other Ambulatory Visit (HOSPITAL_BASED_OUTPATIENT_CLINIC_OR_DEPARTMENT_OTHER): Payer: Self-pay

## 2021-02-18 ENCOUNTER — Other Ambulatory Visit (HOSPITAL_BASED_OUTPATIENT_CLINIC_OR_DEPARTMENT_OTHER): Payer: Self-pay

## 2021-03-15 ENCOUNTER — Other Ambulatory Visit (HOSPITAL_BASED_OUTPATIENT_CLINIC_OR_DEPARTMENT_OTHER): Payer: Self-pay

## 2021-03-22 ENCOUNTER — Other Ambulatory Visit (HOSPITAL_BASED_OUTPATIENT_CLINIC_OR_DEPARTMENT_OTHER): Payer: Self-pay

## 2021-04-14 ENCOUNTER — Other Ambulatory Visit: Payer: Self-pay

## 2021-04-14 ENCOUNTER — Other Ambulatory Visit: Payer: Commercial Managed Care - PPO | Admitting: Internal Medicine

## 2021-04-14 DIAGNOSIS — E039 Hypothyroidism, unspecified: Secondary | ICD-10-CM

## 2021-04-14 DIAGNOSIS — Z0000 Encounter for general adult medical examination without abnormal findings: Secondary | ICD-10-CM

## 2021-04-14 DIAGNOSIS — Z8659 Personal history of other mental and behavioral disorders: Secondary | ICD-10-CM

## 2021-04-14 DIAGNOSIS — R739 Hyperglycemia, unspecified: Secondary | ICD-10-CM

## 2021-04-14 DIAGNOSIS — E7801 Familial hypercholesterolemia: Secondary | ICD-10-CM

## 2021-04-15 ENCOUNTER — Other Ambulatory Visit: Payer: Commercial Managed Care - PPO | Admitting: Internal Medicine

## 2021-04-15 LAB — LIPID PANEL
Cholesterol: 186 mg/dL (ref <200)
HDL: 61 mg/dL (ref >=50)
LDL Cholesterol (Calc): 108 mg/dL (calc) — ABNORMAL HIGH
Non-HDL Cholesterol (Calc): 125 mg/dL (calc) (ref <130)
Total CHOL/HDL Ratio: 3 (calc) (ref <5.0)
Triglycerides: 83 mg/dL (ref <150)

## 2021-04-15 LAB — COMPLETE METABOLIC PANEL WITH GFR
AG Ratio: 1.4 (calc) (ref 1.0–2.5)
ALT: 18 U/L (ref 6–29)
AST: 15 U/L (ref 10–35)
Albumin: 4.2 g/dL (ref 3.6–5.1)
Alkaline phosphatase (APISO): 69 U/L (ref 37–153)
BUN: 12 mg/dL (ref 7–25)
CO2: 31 mmol/L (ref 20–32)
Calcium: 9.8 mg/dL (ref 8.6–10.4)
Chloride: 105 mmol/L (ref 98–110)
Creat: 0.68 mg/dL (ref 0.50–1.03)
Globulin: 2.9 g/dL (calc) (ref 1.9–3.7)
Glucose, Bld: 92 mg/dL (ref 65–99)
Potassium: 4.5 mmol/L (ref 3.5–5.3)
Sodium: 143 mmol/L (ref 135–146)
Total Bilirubin: 0.6 mg/dL (ref 0.2–1.2)
Total Protein: 7.1 g/dL (ref 6.1–8.1)
eGFR: 105 mL/min/{1.73_m2} (ref >=60)

## 2021-04-15 LAB — CBC WITH DIFFERENTIAL/PLATELET
Absolute Monocytes: 428 cells/uL (ref 200–950)
Basophils Absolute: 37 cells/uL (ref 0–200)
Basophils Relative: 0.6 %
Eosinophils Absolute: 93 cells/uL (ref 15–500)
Eosinophils Relative: 1.5 %
HCT: 42.5 % (ref 35.0–45.0)
Hemoglobin: 14.2 g/dL (ref 11.7–15.5)
Lymphs Abs: 1773 cells/uL (ref 850–3900)
MCH: 30.5 pg (ref 27.0–33.0)
MCHC: 33.4 g/dL (ref 32.0–36.0)
MCV: 91.4 fL (ref 80.0–100.0)
MPV: 10.5 fL (ref 7.5–12.5)
Monocytes Relative: 6.9 %
Neutro Abs: 3869 cells/uL (ref 1500–7800)
Neutrophils Relative %: 62.4 %
Platelets: 388 10*3/uL (ref 140–400)
RBC: 4.65 10*6/uL (ref 3.80–5.10)
RDW: 12.1 % (ref 11.0–15.0)
Total Lymphocyte: 28.6 %
WBC: 6.2 10*3/uL (ref 3.8–10.8)

## 2021-04-15 LAB — HEMOGLOBIN A1C
Hgb A1c MFr Bld: 5.5 % of total Hgb (ref <5.7)
Mean Plasma Glucose: 111 mg/dL
eAG (mmol/L): 6.2 mmol/L

## 2021-04-15 LAB — TSH: TSH: 2.5 mIU/L

## 2021-04-18 ENCOUNTER — Other Ambulatory Visit (HOSPITAL_BASED_OUTPATIENT_CLINIC_OR_DEPARTMENT_OTHER): Payer: Self-pay

## 2021-04-18 ENCOUNTER — Ambulatory Visit (INDEPENDENT_AMBULATORY_CARE_PROVIDER_SITE_OTHER): Payer: Commercial Managed Care - PPO | Admitting: Internal Medicine

## 2021-04-18 ENCOUNTER — Other Ambulatory Visit: Payer: Self-pay

## 2021-04-18 ENCOUNTER — Encounter: Payer: Self-pay | Admitting: Internal Medicine

## 2021-04-18 VITALS — BP 128/80 | HR 83 | Temp 98.1°F | Ht 66.0 in | Wt 250.0 lb

## 2021-04-18 DIAGNOSIS — Z8659 Personal history of other mental and behavioral disorders: Secondary | ICD-10-CM | POA: Diagnosis not present

## 2021-04-18 DIAGNOSIS — Z1211 Encounter for screening for malignant neoplasm of colon: Secondary | ICD-10-CM | POA: Diagnosis not present

## 2021-04-18 DIAGNOSIS — E039 Hypothyroidism, unspecified: Secondary | ICD-10-CM | POA: Diagnosis not present

## 2021-04-18 DIAGNOSIS — Z0000 Encounter for general adult medical examination without abnormal findings: Secondary | ICD-10-CM

## 2021-04-18 DIAGNOSIS — E7800 Pure hypercholesterolemia, unspecified: Secondary | ICD-10-CM | POA: Diagnosis not present

## 2021-04-18 DIAGNOSIS — R82998 Other abnormal findings in urine: Secondary | ICD-10-CM

## 2021-04-18 DIAGNOSIS — Z6841 Body Mass Index (BMI) 40.0 and over, adult: Secondary | ICD-10-CM

## 2021-04-18 LAB — POCT URINALYSIS DIPSTICK
Bilirubin, UA: NEGATIVE
Blood, UA: NEGATIVE
Glucose, UA: NEGATIVE
Ketones, UA: NEGATIVE
Nitrite, UA: NEGATIVE
Protein, UA: NEGATIVE
Spec Grav, UA: 1.015 (ref 1.010–1.025)
Urobilinogen, UA: 0.2 E.U./dL
pH, UA: 6.5 (ref 5.0–8.0)
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
## 2021-04-18 NOTE — Patient Instructions (Addendum)
It was a pleasure to see you today.Labs reviewed and are stable. Mentioned gastric by-pass surgery as an option for weight loss. Continue same meds. RTC one year or as needed. Aquaphor to be applied to area of intertrigo inner left leg.

## 2021-04-18 NOTE — Progress Notes (Incomplete)
° °  Subjective:    Patient ID: Brandy Price, female    DOB: Aug 08, 1970, 51 y.o.   MRN: [id number]  HPI  51 year old Female seen for health maintenance exam and evaluation of medical issues.    Review of Systems     Objective:   Physical Exam        Assessment & Plan:

## 2021-04-19 LAB — URINE CULTURE
MICRO NUMBER:: 12875207
Result:: NO GROWTH
SPECIMEN QUALITY:: ADEQUATE

## 2021-04-20 ENCOUNTER — Other Ambulatory Visit: Payer: Self-pay | Admitting: Internal Medicine

## 2021-04-20 ENCOUNTER — Other Ambulatory Visit (HOSPITAL_BASED_OUTPATIENT_CLINIC_OR_DEPARTMENT_OTHER): Payer: Self-pay

## 2021-04-21 ENCOUNTER — Other Ambulatory Visit (HOSPITAL_BASED_OUTPATIENT_CLINIC_OR_DEPARTMENT_OTHER): Payer: Self-pay

## 2021-04-21 MED FILL — Simvastatin Tab 80 MG: ORAL | 30 days supply | Qty: 30 | Fill #0 | Status: AC

## 2021-04-29 ENCOUNTER — Other Ambulatory Visit (HOSPITAL_BASED_OUTPATIENT_CLINIC_OR_DEPARTMENT_OTHER): Payer: Self-pay

## 2021-05-22 ENCOUNTER — Other Ambulatory Visit (HOSPITAL_BASED_OUTPATIENT_CLINIC_OR_DEPARTMENT_OTHER): Payer: Self-pay

## 2021-05-22 ENCOUNTER — Other Ambulatory Visit: Payer: Self-pay | Admitting: Internal Medicine

## 2021-05-22 MED ORDER — LEVOTHYROXINE SODIUM 125 MCG PO TABS
125.0000 ug | ORAL_TABLET | Freq: Every day | ORAL | 3 refills | Status: DC
  Filled 2021-05-22: qty 90, 90d supply, fill #0

## 2021-05-22 MED ORDER — SERTRALINE HCL 100 MG PO TABS
100.0000 mg | ORAL_TABLET | Freq: Every day | ORAL | 3 refills | Status: DC
  Filled 2021-05-22: qty 90, 90d supply, fill #0

## 2021-05-22 MED FILL — Simvastatin Tab 80 MG: ORAL | 90 days supply | Qty: 90 | Fill #0 | Status: AC

## 2021-05-23 ENCOUNTER — Other Ambulatory Visit (HOSPITAL_BASED_OUTPATIENT_CLINIC_OR_DEPARTMENT_OTHER): Payer: Self-pay

## 2021-05-24 ENCOUNTER — Encounter: Payer: Self-pay | Admitting: Internal Medicine

## 2021-05-24 ENCOUNTER — Other Ambulatory Visit (HOSPITAL_BASED_OUTPATIENT_CLINIC_OR_DEPARTMENT_OTHER): Payer: Self-pay

## 2021-08-08 ENCOUNTER — Other Ambulatory Visit: Payer: Self-pay

## 2021-08-08 MED ORDER — SIMVASTATIN 80 MG PO TABS: ORAL_TABLET | ORAL | 0 refills | Status: DC

## 2021-08-14 ENCOUNTER — Other Ambulatory Visit: Payer: Self-pay | Admitting: Internal Medicine

## 2021-09-04 IMAGING — MG DIGITAL SCREENING BILAT W/ TOMO W/ CAD
8 series · 8 of 24 positions shown · non-contrast
Comparison: Previous exam(s).

CLINICAL DATA: Screening.

EXAM:
DIGITAL SCREENING BILATERAL MAMMOGRAM WITH TOMO AND CAD

[R MLO synth-2D]
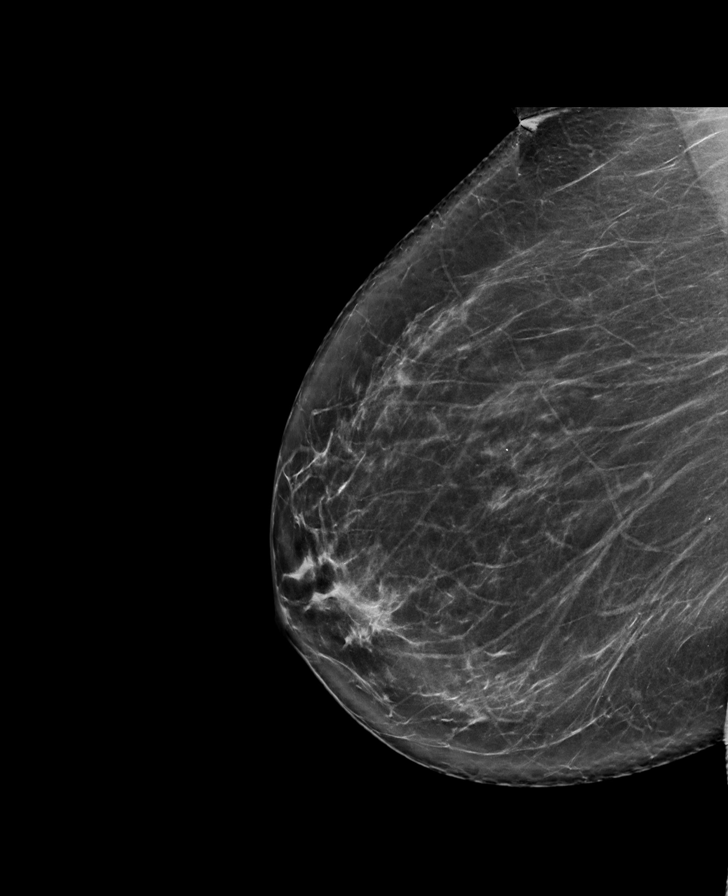

[R CC synth-2D]
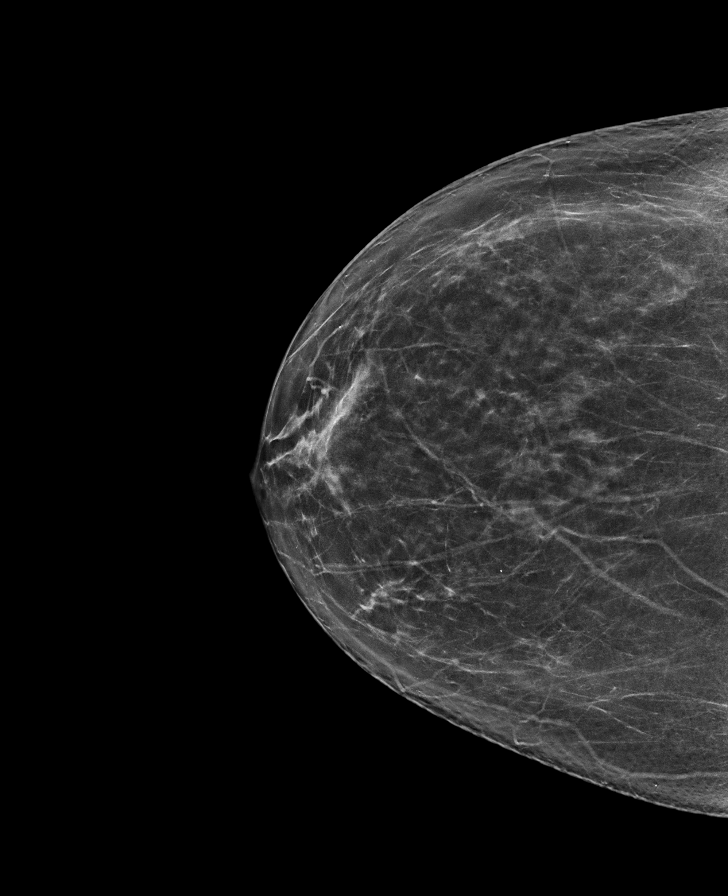

[L CC synth-2D]
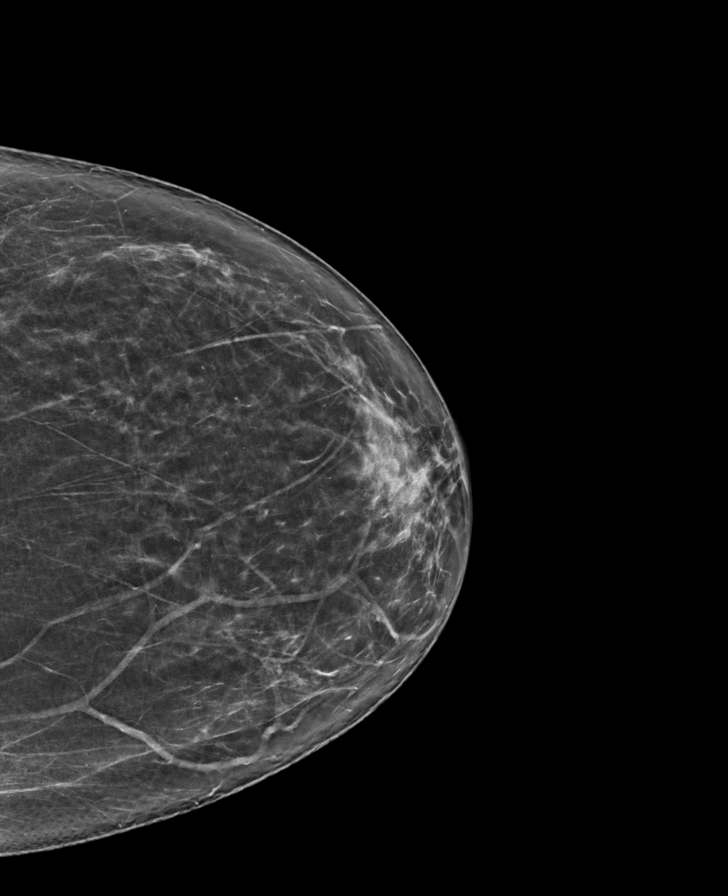

[L MLO synth-2D]
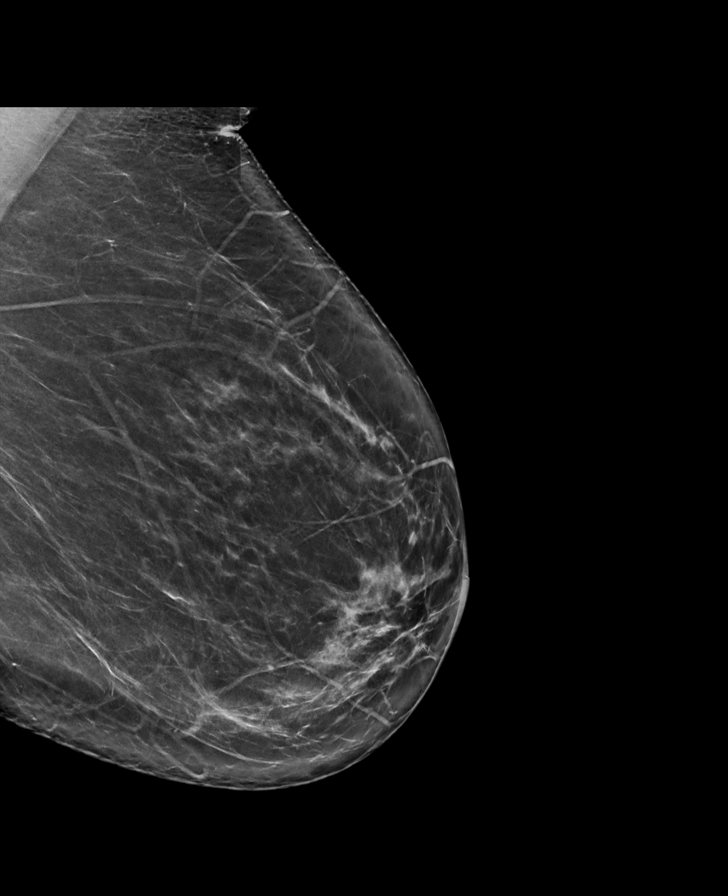

[L CC tomo · tomo slice 38/75.0]
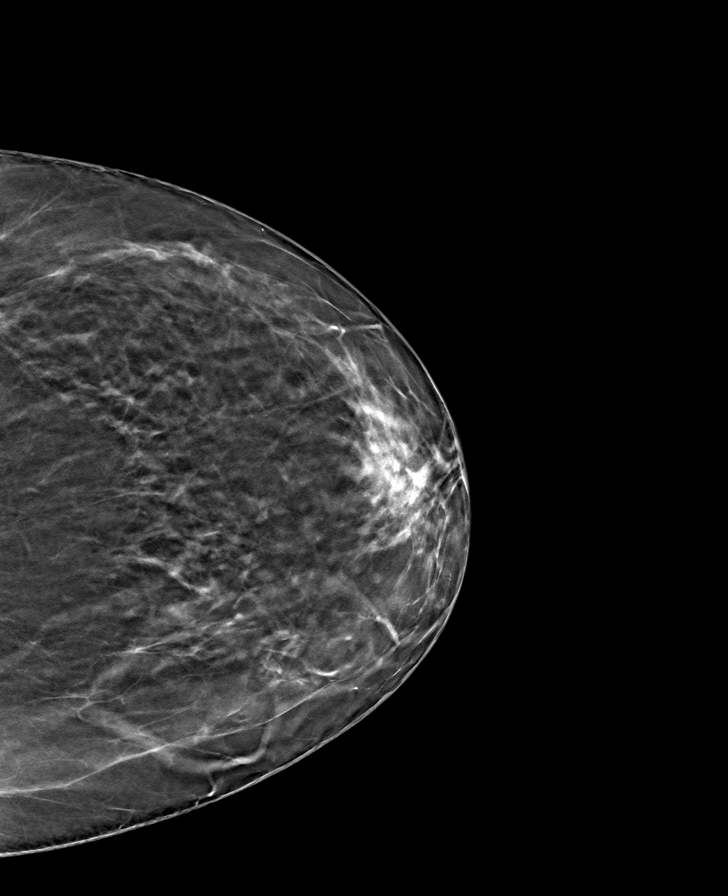

[L MLO tomo · tomo slice 43/86.0]
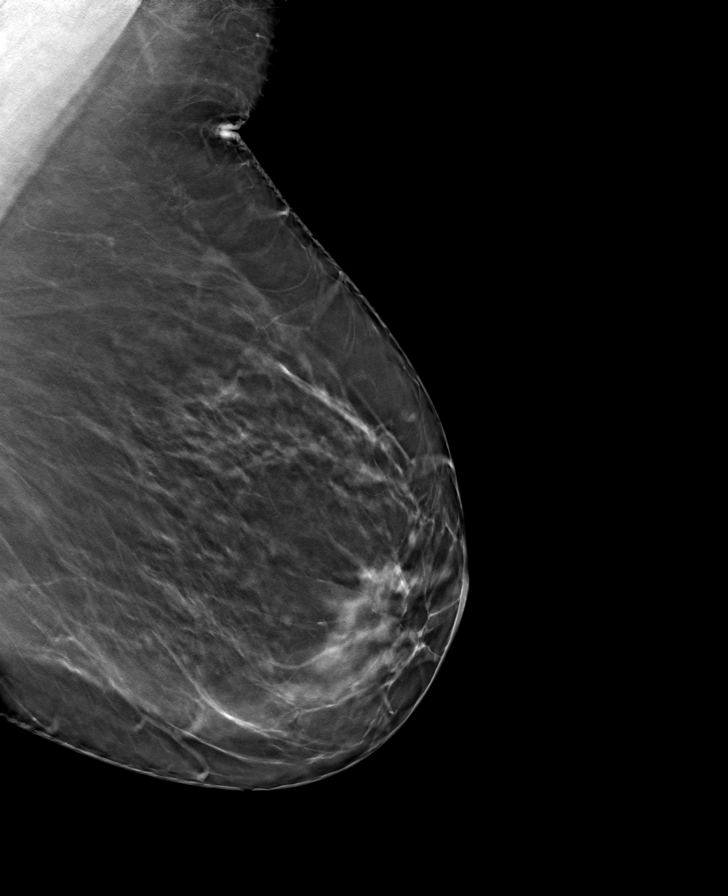

[R CC tomo · tomo slice 39/77.0]
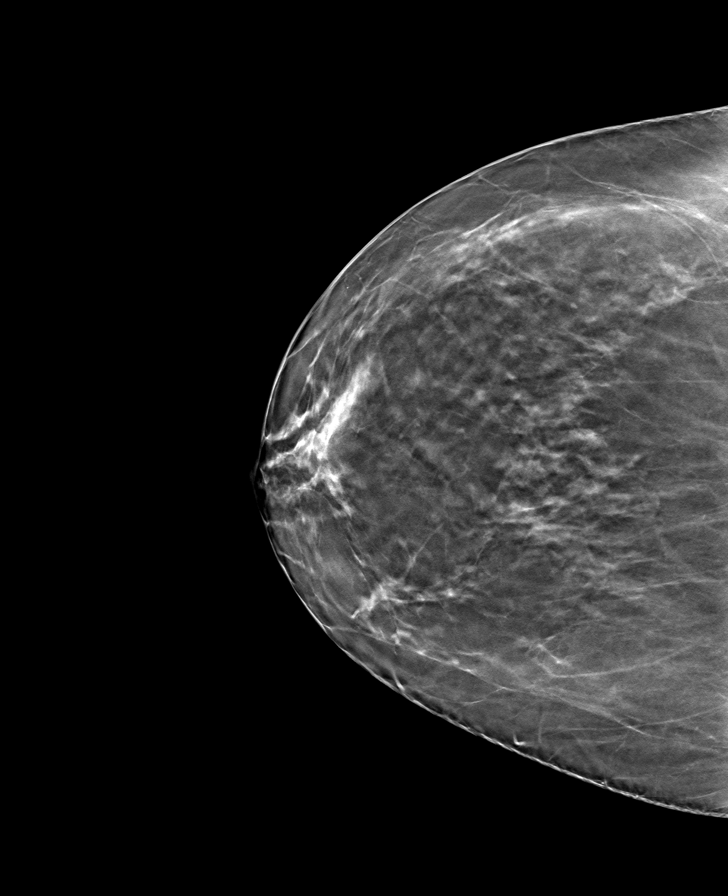

[R MLO tomo · tomo slice 43/85.0]
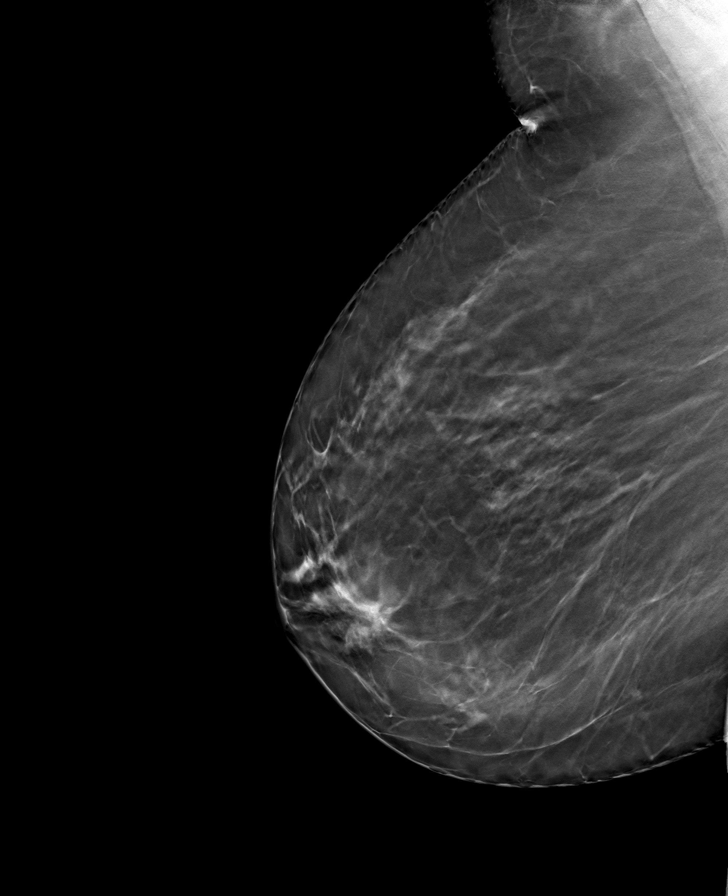

[8 of 24 positions shown; findings below may reference images not displayed]

ACR Breast Density Category b: There are scattered areas of
fibroglandular density.
FINDINGS: There are no findings suspicious for malignancy. Images were
processed with CAD.
IMPRESSION: No mammographic evidence of malignancy. A result letter of this
screening mammogram will be mailed directly to the patient.

RECOMMENDATION:
Screening mammogram in one year. (Code:CN-U-775)

BI-RADS CATEGORY  1: Negative.

## 2021-10-02 ENCOUNTER — Other Ambulatory Visit: Payer: Self-pay | Admitting: Internal Medicine

## 2021-11-08 ENCOUNTER — Telehealth: Payer: Self-pay | Admitting: Internal Medicine

## 2021-11-08 ENCOUNTER — Encounter: Payer: Self-pay | Admitting: Internal Medicine

## 2021-11-08 DIAGNOSIS — Z0289 Encounter for other administrative examinations: Secondary | ICD-10-CM

## 2021-11-08 NOTE — Telephone Encounter (Signed)
Faxed Bi[employer] Health Screening Form to [phone], phone [phone]

## 2021-11-09 NOTE — Telephone Encounter (Signed)
This message was sent via FAXCOM, a product from [employer]. [email or web address].biscom.com/                    -------Fax Transmission Report-------  To:               Recipient at [phone] Subject:          FW: Hp Scans Result:           The transmission was successful. Explanation:      All Pages Ok Pages Sent:       3 Connect Time:     1 minutes, 39 seconds Transmit Time:    11/08/2021 14:47 Transfer Rate:    14400 Status Code:      0000 Retry Count:      0 Job Id:           [id number] Unique Id:        [id number] Fax Line:         54 Fax Server:       [employer]

## 2021-11-21 NOTE — Telephone Encounter (Signed)
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

## 2021-12-20 ENCOUNTER — Other Ambulatory Visit: Payer: Self-pay | Admitting: Internal Medicine

## 2022-01-04 ENCOUNTER — Other Ambulatory Visit: Payer: Self-pay | Admitting: Internal Medicine

## 2022-01-27 ENCOUNTER — Other Ambulatory Visit (HOSPITAL_BASED_OUTPATIENT_CLINIC_OR_DEPARTMENT_OTHER): Payer: Self-pay

## 2022-01-27 MED ORDER — FLUARIX QUADRIVALENT 0.5 ML IM SUSY
PREFILLED_SYRINGE | INTRAMUSCULAR | 0 refills | Status: AC
  Filled 2022-01-27: qty 0.5, 1d supply, fill #0

## 2022-01-27 MED ORDER — COMIRNATY 30 MCG/0.3ML IM SUSY
PREFILLED_SYRINGE | INTRAMUSCULAR | 0 refills | Status: AC
Start: 2022-01-27 — End: ?
  Filled 2022-01-27: qty 0.3, 1d supply, fill #0

## 2022-02-10 LAB — HM MAMMOGRAPHY

## 2022-02-13 ENCOUNTER — Encounter: Payer: Self-pay | Admitting: Internal Medicine

## 2022-04-24 ENCOUNTER — Other Ambulatory Visit: Payer: Commercial Managed Care - PPO

## 2022-04-24 DIAGNOSIS — E039 Hypothyroidism, unspecified: Secondary | ICD-10-CM

## 2022-04-24 DIAGNOSIS — Z136 Encounter for screening for cardiovascular disorders: Secondary | ICD-10-CM

## 2022-04-24 DIAGNOSIS — E7800 Pure hypercholesterolemia, unspecified: Secondary | ICD-10-CM

## 2022-04-25 LAB — COMPLETE METABOLIC PANEL WITH GFR
AG Ratio: 1.5 (calc) (ref 1.0–2.5)
ALT: 21 U/L (ref 6–29)
AST: 16 U/L (ref 10–35)
Albumin: 4.4 g/dL (ref 3.6–5.1)
Alkaline phosphatase (APISO): 63 U/L (ref 37–153)
BUN: 13 mg/dL (ref 7–25)
CO2: 27 mmol/L (ref 20–32)
Calcium: 9.7 mg/dL (ref 8.6–10.4)
Chloride: 105 mmol/L (ref 98–110)
Creat: 0.61 mg/dL (ref 0.50–1.03)
Globulin: 2.9 g/dL (calc) (ref 1.9–3.7)
Glucose, Bld: 90 mg/dL (ref 65–99)
Potassium: 4.5 mmol/L (ref 3.5–5.3)
Sodium: 142 mmol/L (ref 135–146)
Total Bilirubin: 0.4 mg/dL (ref 0.2–1.2)
Total Protein: 7.3 g/dL (ref 6.1–8.1)
eGFR: 107 mL/min/{1.73_m2} (ref >=60)

## 2022-04-25 LAB — LIPID PANEL
Cholesterol: 187 mg/dL (ref <200)
HDL: 66 mg/dL (ref >=50)
LDL Cholesterol (Calc): 103 mg/dL (calc) — ABNORMAL HIGH
Non-HDL Cholesterol (Calc): 121 mg/dL (calc) (ref <130)
Total CHOL/HDL Ratio: 2.8 (calc) (ref <5.0)
Triglycerides: 86 mg/dL (ref <150)

## 2022-04-25 LAB — CBC WITH DIFFERENTIAL/PLATELET
Absolute Monocytes: 360 cells/uL (ref 200–950)
Basophils Absolute: 60 cells/uL (ref 0–200)
Basophils Relative: 1 %
Eosinophils Absolute: 42 cells/uL (ref 15–500)
Eosinophils Relative: 0.7 %
HCT: 42.5 % (ref 35.0–45.0)
Hemoglobin: 14.5 g/dL (ref 11.7–15.5)
Lymphs Abs: 1776 cells/uL (ref 850–3900)
MCH: 31 pg (ref 27.0–33.0)
MCHC: 34.1 g/dL (ref 32.0–36.0)
MCV: 90.8 fL (ref 80.0–100.0)
MPV: 10.5 fL (ref 7.5–12.5)
Monocytes Relative: 6 %
Neutro Abs: 3762 cells/uL (ref 1500–7800)
Neutrophils Relative %: 62.7 %
Platelets: 364 10*3/uL (ref 140–400)
RBC: 4.68 10*6/uL (ref 3.80–5.10)
RDW: 11.6 % (ref 11.0–15.0)
Total Lymphocyte: 29.6 %
WBC: 6 10*3/uL (ref 3.8–10.8)

## 2022-04-25 LAB — TSH: TSH: 3.06 mIU/L

## 2022-05-01 ENCOUNTER — Encounter: Payer: Self-pay | Admitting: Internal Medicine

## 2022-05-01 ENCOUNTER — Ambulatory Visit (INDEPENDENT_AMBULATORY_CARE_PROVIDER_SITE_OTHER): Payer: Commercial Managed Care - PPO | Admitting: Internal Medicine

## 2022-05-01 VITALS — BP 120/80 | HR 79 | Temp 98.6°F | Ht 66.0 in | Wt 249.1 lb

## 2022-05-01 DIAGNOSIS — E7800 Pure hypercholesterolemia, unspecified: Secondary | ICD-10-CM | POA: Diagnosis not present

## 2022-05-01 DIAGNOSIS — Z0000 Encounter for general adult medical examination without abnormal findings: Secondary | ICD-10-CM | POA: Diagnosis not present

## 2022-05-01 DIAGNOSIS — E039 Hypothyroidism, unspecified: Secondary | ICD-10-CM

## 2022-05-01 DIAGNOSIS — Z8659 Personal history of other mental and behavioral disorders: Secondary | ICD-10-CM

## 2022-05-01 LAB — POCT URINALYSIS DIPSTICK
Bilirubin, UA: NEGATIVE
Blood, UA: NEGATIVE
Glucose, UA: NEGATIVE
Ketones, UA: NEGATIVE
Leukocytes, UA: NEGATIVE
Nitrite, UA: NEGATIVE
Protein, UA: NEGATIVE
Spec Grav, UA: 1.015 (ref 1.010–1.025)
Urobilinogen, UA: 0.2 E.U./dL
pH, UA: 5 (ref 5.0–8.0)

## 2022-05-01 NOTE — Progress Notes (Signed)
Patient Care Team: Margaree Mackintosh, MD as PCP - General  Visit Date: 05/01/22  Subjective:    Patient ID: Brandy Price , Female   DOB: 1971/03/09, 52 y.o.    MRN: [id number]   52 y.o. Female presents today for annual comprehensive physical exam. Patient has a past medical history of anxiety and depression, hyperlipidemia, longstanding hypothyroidism, arthritis, and Vitamin D deficiency.  History of longstanding hypothyroidism treated with Synthroid 125 mcg daily before breakfast. She reports stable energy level with this dosage.  History of postpartum depression in 2005 and has remained on her regimen of Zoloft 100 mg daily since that time.  History of hyperlipidemia treated with Zocor 80 mg daily. LDL elevated at 103 on 04/24/22.   History of arthritis treated with glucosamine-chondroitin 500-400 mg three times daily.  History of Vitamin D deficiency treated with Vitamin D 1,000 units daily.  She reports no regular exercise, however she lost 23 pounds between 11/14/19 and 05/01/22. She is interested in doing a calcium test and receiving a referral for [facility].   Pap Smear last completed on 11/14/2019. Results showed no abnormalities. Recommended to repeat 2024.  Mammogram last completed on 02/10/2022. Results showed no significant abnormality or change. Recommended to repeat 2024.  Due for colonoscopy. Never completed. She is interested in a referral for a gastroenterologist.   Past Medical History:  Diagnosis Date   Anxiety    Arthritis    Depression    Hyperlipidemia    Longstanding Hypothyroidism    Obesity    Vitamin D deficiency      Family History  Problem Relation Age of Onset   Glaucoma Mother    Healthy Father    Healthy Daughter    Healthy Son     Social History   Social History Narrative   Social history: She is divorced and has 2 children a son and a daughter.  Does not smoke or consume alcohol.  Parents are supportive.  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       Family history: Mother has glaucoma.  Father doing well.  Patient is an only child.      Review of Systems  Constitutional:  Negative for chills, fever, malaise/fatigue and weight loss.  HENT:  Negative for hearing loss, sinus pain and sore throat.   Respiratory:  Negative for cough and hemoptysis.   Cardiovascular:  Negative for chest pain, palpitations, leg swelling and PND.  Gastrointestinal:  Negative for abdominal pain, constipation, diarrhea, heartburn, nausea and vomiting.  Genitourinary:  Negative for dysuria, frequency and urgency.  Musculoskeletal:  Negative for back pain, myalgias and neck pain.  Skin:  Negative for itching and rash.  Neurological:  Negative for dizziness, tingling, seizures and headaches.  Endo/Heme/Allergies:  Negative for polydipsia.  Psychiatric/Behavioral:  Negative for depression. The patient is not nervous/anxious.         Objective:   Vitals: BP 120/80   Pulse 79   Temp 98.6 F (37 C) (Tympanic)   Ht 5\' 6"  (1.676 m)   Wt 249 lb 1.9 oz (113 kg)   LMP 04/01/2017   SpO2 99%   BMI 40.21 kg/m    Physical Exam Vitals and nursing note reviewed. Exam conducted with a chaperone present.  Constitutional:      General: She is not in acute distress.    Appearance: Normal appearance. She is not ill-appearing or toxic-appearing.  HENT:  Head: Normocephalic and atraumatic.     Right Ear: Hearing, tympanic membrane, ear canal and external ear normal.     Left Ear: Hearing, tympanic membrane, ear canal and external ear normal.     Mouth/Throat:     Mouth: Mucous membranes are moist.     Pharynx: Oropharynx is clear.  Eyes:     Extraocular Movements: Extraocular movements intact.     Pupils: Pupils are equal, round, and reactive to light.  Neck:     Thyroid: No thyroid mass, thyromegaly or thyroid tenderness.     Vascular: No carotid bruit.     Comments: No supraclavicular  adenopathy appreciated. Cardiovascular:     Rate and Rhythm: Normal rate and regular rhythm. No extrasystoles are present.    Pulses: Normal pulses.          Dorsalis pedis pulses are 2+ on the right side and 2+ on the left side.       Posterior tibial pulses are 2+ on the right side and 2+ on the left side.     Heart sounds: Normal heart sounds. No murmur heard.    No friction rub. No gallop.  Pulmonary:     Effort: Pulmonary effort is normal.     Breath sounds: Normal breath sounds. No decreased breath sounds, wheezing, rhonchi or rales.  Chest:     Chest wall: No mass.  Abdominal:     General: There is no distension.     Palpations: Abdomen is soft. There is no hepatomegaly, splenomegaly or mass.     Tenderness: There is no abdominal tenderness.     Hernia: No hernia is present.  Musculoskeletal:     Cervical back: Normal range of motion.     Right lower leg: No edema.     Left lower leg: No edema.  Lymphadenopathy:     Cervical: No cervical adenopathy.     Upper Body:     Right upper body: No supraclavicular adenopathy.     Left upper body: No supraclavicular adenopathy.  Skin:    General: Skin is warm and dry.  Neurological:     General: No focal deficit present.     Mental Status: She is alert and oriented to person, place, and time. Mental status is at baseline.     Sensory: Sensation is intact.     Motor: Motor function is intact. No weakness.     Deep Tendon Reflexes: Reflexes are normal and symmetric.  Psychiatric:        Attention and Perception: Attention normal.        Mood and Affect: Mood normal.        Speech: Speech normal.        Behavior: Behavior normal.        Thought Content: Thought content normal.        Cognition and Memory: Cognition normal.        Judgment: Judgment normal.       Results:   Studies obtained and personally reviewed by me:  Pap Smear last completed on 11/14/2019. Results showed no abnormalities. Advised to repeat in 3  years.  Mammogram last completed on 02/10/2022. Results showed no significant abnormality or change. Recommended to repeat 2024.  Labs:       Component Value Date/Time   NA 142 04/24/2022 1116   K 4.5 04/24/2022 1116   CL 105 04/24/2022 1116   CO2 27 04/24/2022 1116   GLUCOSE 90 04/24/2022 1116   BUN 13 04/24/2022  1116   CREATININE 0.61 04/24/2022 1116   CALCIUM 9.7 04/24/2022 1116   PROT 7.3 04/24/2022 1116   ALBUMIN 3.8 03/31/2016 1035   AST 16 04/24/2022 1116   ALT 21 04/24/2022 1116   ALKPHOS 89 03/31/2016 1035   BILITOT 0.4 04/24/2022 1116   GFRNONAA 99 11/11/2019 1158   GFRAA 114 11/11/2019 1158     Lab Results  Component Value Date   WBC 6.0 04/24/2022   HGB 14.5 04/24/2022   HCT 42.5 04/24/2022   MCV 90.8 04/24/2022   PLT 364 04/24/2022    Lab Results  Component Value Date   CHOL 187 04/24/2022   HDL 66 04/24/2022   LDLCALC 103 (H) 04/24/2022   TRIG 86 04/24/2022   CHOLHDL 2.8 04/24/2022    Lab Results  Component Value Date   HGBA1C 5.5 04/14/2021     Lab Results  Component Value Date   TSH 3.06 04/24/2022      Assessment & Plan:   Longstanding Hypothyroidism: Well-controlled with Synthroid 125 mcg daily before breakfast.  Depression: Well-controlled with Zoloft 100 mg daily.  Hyperlipidemia: Well-controlled with Zocor 80 mg daily. LDL elevated at 103 on 04/24/22.   Arthritis: Symptoms well-controlled with glucosamine-chondroitin 500-400 mg three times daily.  Vitamin D Deficiency: Well-controlled with Vitamin D 1,000 units daily.  Obesity: Advised to consult with [facility].   Pap Smear: Last completed on 11/14/2019. Results showed no abnormalities. Recommended to repeat 2024.  Mammogram: Last completed on 02/10/2022. Results showed no significant abnormality or change. Recommended to repeat 2024.  Colonoscopy: Never completed. Will be referring to a gastroenterologist.  Vaccine Counseling: She is advised to get the  pneumococcal 20, shingles, and RSV vaccines.  Health Maintenance: Advised to do a calcium test.   I,Alexander Ruley,acting as a scribe for Elby Showers, MD.,have documented all relevant documentation on the behalf of Elby Showers, MD,as directed by  Elby Showers, MD while in the presence of Elby Showers, MD.   I, Elby Showers, MD, have reviewed all documentation for this visit. The documentation on 05/02/22 for the exam, diagnosis, procedures, and orders are all accurate and complete.

## 2022-05-29 ENCOUNTER — Other Ambulatory Visit: Payer: Self-pay | Admitting: Internal Medicine

## 2022-06-15 ENCOUNTER — Other Ambulatory Visit: Payer: Self-pay | Admitting: Internal Medicine

## 2022-09-07 ENCOUNTER — Telehealth: Payer: Self-pay | Admitting: Internal Medicine

## 2022-09-07 DIAGNOSIS — Z0289 Encounter for other administrative examinations: Secondary | ICD-10-CM

## 2022-09-07 NOTE — Telephone Encounter (Signed)
Received Biometric Form in the mail from [employer]. We have filled it out and faxed back to [facility] [phone], phone [phone]      -------Fax Transmission Report-------  To:               Recipient at [phone] Subject:          Fw: Hp Scans Result:           The transmission was successful. Explanation:      All Pages Ok Pages Sent:       3 Connect Time:     1 minutes, 36 seconds Transmit Time:    09/07/2022 12:36 Transfer Rate:    14400 Status Code:      0000 Retry Count:      0 Job Id:           [id number] Unique Id:        [id number] Fax Line:         7 Fax Server:       [employer]

## 2022-11-26 ENCOUNTER — Other Ambulatory Visit: Payer: Self-pay | Admitting: Internal Medicine

## 2023-01-05 ENCOUNTER — Other Ambulatory Visit: Payer: Self-pay | Admitting: Internal Medicine

## 2023-01-05 DIAGNOSIS — Z1212 Encounter for screening for malignant neoplasm of rectum: Secondary | ICD-10-CM

## 2023-01-05 DIAGNOSIS — Z1211 Encounter for screening for malignant neoplasm of colon: Secondary | ICD-10-CM

## 2023-01-25 LAB — COLOGUARD: COLOGUARD: NEGATIVE

## 2023-02-16 LAB — HM MAMMOGRAPHY

## 2023-02-22 ENCOUNTER — Encounter: Payer: Self-pay | Admitting: Internal Medicine

## 2023-04-16 ENCOUNTER — Other Ambulatory Visit: Payer: Self-pay | Admitting: Internal Medicine

## 2023-04-16 ENCOUNTER — Other Ambulatory Visit (HOSPITAL_BASED_OUTPATIENT_CLINIC_OR_DEPARTMENT_OTHER): Payer: Self-pay

## 2023-04-16 MED ORDER — LEVOTHYROXINE SODIUM 125 MCG PO TABS
125.0000 ug | ORAL_TABLET | Freq: Every day | ORAL | 3 refills | Status: DC
  Filled 2023-04-16: qty 30, 30d supply, fill #0

## 2023-04-16 NOTE — Telephone Encounter (Signed)
 Copied from CRM [id number]. Topic: Clinical - Medication Refill >> Apr 16, 2023 12:51 PM Zebedee SAUNDERS wrote: Most Recent Primary Care Visit:  Provider: [facility] RONAL PARAS  Department: [facility]  Visit Type: PHYSICAL 45  Date: 05/01/2022  Medication: ***  Has the patient contacted their pharmacy?  (Agent: If no, request that the patient contact the pharmacy for the refill. If patient does not wish to contact the pharmacy document the reason why and proceed with request.) (Agent: If yes, when and what did the pharmacy advise?)  Is this the correct pharmacy for this prescription?  If no, delete pharmacy and type the correct one.  This is the patient's preferred pharmacy:  [facility] [place] [facility] [address] [place] [address] Phone: [phone] Fax: [phone]  [facility] [place] [place] - [address] [place] [address] Phone: [phone] Fax: [phone]  [facility] [address] [place] - [address] [place] [address] Phone: [phone] Fax: [phone]   Has the prescription been filled recently?   Is the patient out of the medication?   Has the patient been seen for an appointment in the last year OR does the patient have an upcoming appointment?   Can we respond through MyChart?   Agent: Please be advised that Rx refills may take up to 3 business days. We ask that you follow-up with your pharmacy.

## 2023-04-17 NOTE — Telephone Encounter (Signed)
 Brand change

## 2023-04-17 NOTE — Telephone Encounter (Signed)
 Copied from CRM [id number]. Topic: Clinical - Prescription Issue >> Apr 17, 2023 10:06 AM Wyona SQUIBB wrote: Reason for CRM: [facility] Called in about PT medication levothyroxine  (SYNTHROID ) 125 MCG tablet. Manufacture is changing from Ammiel to [employer]up[employer] and they are required to get verification. Requesting call back for verification at [phone] when calling please provide Refrence #: [id number]

## 2023-04-23 ENCOUNTER — Telehealth: Payer: Self-pay | Admitting: Internal Medicine

## 2023-04-23 NOTE — Telephone Encounter (Signed)
Copied from CRM [id number]. Topic: Clinical - Prescription Issue >> Apr 18, 2023  1:18 PM Fonda Kinder J wrote: Reason for CRM: [facility] states they need approval for levothyroxine (SYNTHROID) 125 MCG. The representative states a fax has been sent over and they are still awaiting a response to fill the medication. She states a verbal approved can be made by calling [phone]

## 2023-04-30 ENCOUNTER — Other Ambulatory Visit: Payer: Commercial Managed Care - PPO

## 2023-04-30 DIAGNOSIS — Z13220 Encounter for screening for lipoid disorders: Secondary | ICD-10-CM

## 2023-04-30 DIAGNOSIS — Z1212 Encounter for screening for malignant neoplasm of rectum: Secondary | ICD-10-CM

## 2023-04-30 DIAGNOSIS — R739 Hyperglycemia, unspecified: Secondary | ICD-10-CM

## 2023-04-30 DIAGNOSIS — E7800 Pure hypercholesterolemia, unspecified: Secondary | ICD-10-CM

## 2023-04-30 DIAGNOSIS — E039 Hypothyroidism, unspecified: Secondary | ICD-10-CM

## 2023-05-01 LAB — COMPLETE METABOLIC PANEL WITH GFR
AG Ratio: 1.4 (calc) (ref 1.0–2.5)
ALT: 27 U/L (ref 6–29)
AST: 17 U/L (ref 10–35)
Albumin: 4.2 g/dL (ref 3.6–5.1)
Alkaline phosphatase (APISO): 65 U/L (ref 37–153)
BUN: 15 mg/dL (ref 7–25)
CO2: 28 mmol/L (ref 20–32)
Calcium: 9.6 mg/dL (ref 8.6–10.4)
Chloride: 105 mmol/L (ref 98–110)
Creat: 0.65 mg/dL (ref 0.50–1.03)
Globulin: 3 g/dL (ref 1.9–3.7)
Glucose, Bld: 87 mg/dL (ref 65–99)
Potassium: 4.4 mmol/L (ref 3.5–5.3)
Sodium: 141 mmol/L (ref 135–146)
Total Bilirubin: 0.4 mg/dL (ref 0.2–1.2)
Total Protein: 7.2 g/dL (ref 6.1–8.1)
eGFR: 105 mL/min/{1.73_m2} (ref >=60)

## 2023-05-01 LAB — LIPID PANEL
Cholesterol: 204 mg/dL — ABNORMAL HIGH (ref <200)
HDL: 71 mg/dL (ref >=50)
LDL Cholesterol (Calc): 113 mg/dL — ABNORMAL HIGH
Non-HDL Cholesterol (Calc): 133 mg/dL — ABNORMAL HIGH (ref <130)
Total CHOL/HDL Ratio: 2.9 (calc) (ref <5.0)
Triglycerides: 92 mg/dL (ref <150)

## 2023-05-01 LAB — CBC WITH DIFFERENTIAL/PLATELET
Absolute Lymphocytes: 1830 {cells}/uL (ref 850–3900)
Absolute Monocytes: 390 {cells}/uL (ref 200–950)
Basophils Absolute: 58 {cells}/uL (ref 0–200)
Basophils Relative: 0.9 %
Eosinophils Absolute: 83 {cells}/uL (ref 15–500)
Eosinophils Relative: 1.3 %
HCT: 44.1 % (ref 35.0–45.0)
Hemoglobin: 14.5 g/dL (ref 11.7–15.5)
MCH: 30.3 pg (ref 27.0–33.0)
MCHC: 32.9 g/dL (ref 32.0–36.0)
MCV: 92.3 fL (ref 80.0–100.0)
MPV: 10.4 fL (ref 7.5–12.5)
Monocytes Relative: 6.1 %
Neutro Abs: 4038 {cells}/uL (ref 1500–7800)
Neutrophils Relative %: 63.1 %
Platelets: 342 10*3/uL (ref 140–400)
RBC: 4.78 10*6/uL (ref 3.80–5.10)
RDW: 11.8 % (ref 11.0–15.0)
Total Lymphocyte: 28.6 %
WBC: 6.4 10*3/uL (ref 3.8–10.8)

## 2023-05-01 LAB — HEMOGLOBIN A1C
Hgb A1c MFr Bld: 5.6 %{Hb} (ref <5.7)
Mean Plasma Glucose: 114 mg/dL
eAG (mmol/L): 6.3 mmol/L

## 2023-05-01 LAB — TSH: TSH: 3.35 m[IU]/L
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
## 2023-05-07 ENCOUNTER — Ambulatory Visit: Payer: Commercial Managed Care - PPO | Admitting: Internal Medicine

## 2023-05-07 ENCOUNTER — Other Ambulatory Visit (HOSPITAL_COMMUNITY)
Admission: RE | Admit: 2023-05-07 | Discharge: 2023-05-07 | Disposition: A | Discharge status: Home / Self Care | Payer: Commercial Managed Care - PPO | Source: Ambulatory Visit | Attending: Internal Medicine | Admitting: Internal Medicine

## 2023-05-07 VITALS — BP 138/78 | HR 103 | Temp 98.0°F | Resp 16 | Ht 66.0 in | Wt 265.8 lb

## 2023-05-07 DIAGNOSIS — E039 Hypothyroidism, unspecified: Secondary | ICD-10-CM

## 2023-05-07 DIAGNOSIS — Z0000 Encounter for general adult medical examination without abnormal findings: Secondary | ICD-10-CM | POA: Diagnosis present

## 2023-05-07 DIAGNOSIS — E7800 Pure hypercholesterolemia, unspecified: Secondary | ICD-10-CM

## 2023-05-07 DIAGNOSIS — Z6841 Body Mass Index (BMI) 40.0 and over, adult: Secondary | ICD-10-CM | POA: Diagnosis not present

## 2023-05-07 DIAGNOSIS — Z0001 Encounter for general adult medical examination with abnormal findings: Secondary | ICD-10-CM | POA: Diagnosis not present

## 2023-05-07 DIAGNOSIS — Z23 Encounter for immunization: Secondary | ICD-10-CM | POA: Diagnosis not present

## 2023-05-07 DIAGNOSIS — Z8659 Personal history of other mental and behavioral disorders: Secondary | ICD-10-CM | POA: Diagnosis not present

## 2023-05-09 LAB — CYTOLOGY - PAP: Diagnosis: NEGATIVE

## 2023-05-19 ENCOUNTER — Encounter: Payer: Self-pay | Admitting: Internal Medicine

## 2023-05-19 NOTE — Patient Instructions (Addendum)
 It was a pleasure to see you today. Thank you for being a long time patient in this practice. Labs reviewed and meds refilled. Continue diet and exercise efforts. Return in one year. Flu vaccine given.

## 2023-06-01 ENCOUNTER — Other Ambulatory Visit: Payer: Self-pay | Admitting: Internal Medicine

## 2023-06-05 ENCOUNTER — Other Ambulatory Visit: Payer: Self-pay | Admitting: Internal Medicine

## 2023-06-05 MED ORDER — PROMETHAZINE HCL 25 MG PO TABS: 25.0000 mg | ORAL_TABLET | Freq: Three times a day (TID) | ORAL | 0 refills | Status: AC | PRN

## 2023-06-18 ENCOUNTER — Other Ambulatory Visit: Payer: Self-pay | Admitting: Internal Medicine

## 2023-11-03 ENCOUNTER — Other Ambulatory Visit: Payer: Self-pay | Admitting: Family

## 2023-11-21 ENCOUNTER — Encounter: Payer: Self-pay | Admitting: Internal Medicine

## 2023-11-22 ENCOUNTER — Other Ambulatory Visit: Payer: Self-pay

## 2023-11-22 MED ORDER — SIMVASTATIN 80 MG PO TABS: 80.0000 mg | ORAL_TABLET | Freq: Every day | ORAL | 3 refills | Status: AC
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
## 2024-05-02 ENCOUNTER — Telehealth: Payer: Self-pay | Admitting: Internal Medicine

## 2024-05-02 NOTE — Telephone Encounter (Signed)
 Done

## 2024-05-06 ENCOUNTER — Other Ambulatory Visit

## 2024-05-06 ENCOUNTER — Other Ambulatory Visit: Payer: Commercial Managed Care - PPO

## 2024-05-06 DIAGNOSIS — E039 Hypothyroidism, unspecified: Secondary | ICD-10-CM

## 2024-05-06 DIAGNOSIS — E7800 Pure hypercholesterolemia, unspecified: Secondary | ICD-10-CM

## 2024-05-06 DIAGNOSIS — Z0000 Encounter for general adult medical examination without abnormal findings: Secondary | ICD-10-CM

## 2024-05-06 DIAGNOSIS — R7302 Impaired glucose tolerance (oral): Secondary | ICD-10-CM

## 2024-05-06 DIAGNOSIS — Z1329 Encounter for screening for other suspected endocrine disorder: Secondary | ICD-10-CM

## 2024-05-07 LAB — CBC WITH DIFFERENTIAL/PLATELET
Absolute Lymphocytes: 1658 {cells}/uL (ref 850–3900)
Absolute Monocytes: 428 {cells}/uL (ref 200–950)
Basophils Absolute: 53 {cells}/uL (ref 0–200)
Basophils Relative: 0.7 %
Eosinophils Absolute: 83 {cells}/uL (ref 15–500)
Eosinophils Relative: 1.1 %
HCT: 45.4 % (ref 35.9–46.0)
Hemoglobin: 15.3 g/dL (ref 11.7–15.5)
MCH: 31 pg (ref 27.0–33.0)
MCHC: 33.7 g/dL (ref 31.6–35.4)
MCV: 91.9 fL (ref 81.4–101.7)
MPV: 10.3 fL (ref 7.5–12.5)
Monocytes Relative: 5.7 %
Neutro Abs: 5280 {cells}/uL (ref 1500–7800)
Neutrophils Relative %: 70.4 %
Platelets: 401 10*3/uL — ABNORMAL HIGH (ref 140–400)
RBC: 4.94 Million/uL (ref 3.80–5.10)
RDW: 11.8 % (ref 11.0–15.0)
Total Lymphocyte: 22.1 %
WBC: 7.5 10*3/uL (ref 3.8–10.8)

## 2024-05-07 LAB — LIPID PANEL
Cholesterol: 196 mg/dL
HDL: 66 mg/dL
LDL Cholesterol (Calc): 109 mg/dL — ABNORMAL HIGH
Non-HDL Cholesterol (Calc): 130 mg/dL — ABNORMAL HIGH
Total CHOL/HDL Ratio: 3 (calc)
Triglycerides: 100 mg/dL

## 2024-05-07 LAB — HEMOGLOBIN A1C
Hgb A1c MFr Bld: 5.3 %
Mean Plasma Glucose: 105 mg/dL
eAG (mmol/L): 5.8 mmol/L

## 2024-05-07 LAB — COMPREHENSIVE METABOLIC PANEL WITH GFR
AG Ratio: 1.5 (calc) (ref 1.0–2.5)
ALT: 19 U/L (ref 6–29)
AST: 15 U/L (ref 10–35)
Albumin: 4.3 g/dL (ref 3.6–5.1)
Alkaline phosphatase (APISO): 79 U/L (ref 37–153)
BUN: 13 mg/dL (ref 7–25)
CO2: 28 mmol/L (ref 20–32)
Calcium: 9.6 mg/dL (ref 8.6–10.4)
Chloride: 104 mmol/L (ref 98–110)
Creat: 0.65 mg/dL (ref 0.50–1.03)
Globulin: 2.8 g/dL (ref 1.9–3.7)
Glucose, Bld: 96 mg/dL (ref 65–99)
Potassium: 4.5 mmol/L (ref 3.5–5.3)
Sodium: 141 mmol/L (ref 135–146)
Total Bilirubin: 0.7 mg/dL (ref 0.2–1.2)
Total Protein: 7.1 g/dL (ref 6.1–8.1)
eGFR: 104 mL/min/{1.73_m2}

## 2024-05-07 LAB — TSH: TSH: 2.62 m[IU]/L

## 2024-05-08 ENCOUNTER — Encounter: Payer: Self-pay | Admitting: Internal Medicine

## 2024-05-08 ENCOUNTER — Ambulatory Visit: Payer: Commercial Managed Care - PPO | Admitting: Internal Medicine

## 2024-05-08 VITALS — BP 130/88 | HR 97 | Ht 65.5 in | Wt 261.0 lb

## 2024-05-08 DIAGNOSIS — Z0000 Encounter for general adult medical examination without abnormal findings: Secondary | ICD-10-CM

## 2025-05-11 ENCOUNTER — Other Ambulatory Visit

## 2025-05-14 ENCOUNTER — Encounter: Admitting: Internal Medicine
